# Patient Record
Sex: Male | Born: 1980 | Race: Black or African American | Hispanic: No | Marital: Single | Smoking: Never smoker
Health system: Southern US, Community
[De-identification: ages and names within clinical notes are randomized; demographics above are authoritative.]

## PROBLEM LIST (undated history)

## (undated) DIAGNOSIS — F840 Autistic disorder: Secondary | ICD-10-CM

## (undated) DIAGNOSIS — R569 Unspecified convulsions: Secondary | ICD-10-CM

## (undated) HISTORY — DX: Autistic disorder: F84.0

---

## 2005-10-23 ENCOUNTER — Other Ambulatory Visit: Payer: Self-pay

## 2005-10-23 ENCOUNTER — Emergency Department: Payer: Self-pay | Admitting: Emergency Medicine

## 2006-03-27 ENCOUNTER — Ambulatory Visit: Payer: Self-pay | Admitting: Internal Medicine

## 2006-04-09 ENCOUNTER — Ambulatory Visit: Payer: Self-pay | Admitting: Internal Medicine

## 2006-04-11 ENCOUNTER — Emergency Department: Payer: Self-pay | Admitting: Emergency Medicine

## 2006-05-09 ENCOUNTER — Ambulatory Visit: Payer: Self-pay | Admitting: Internal Medicine

## 2006-06-09 ENCOUNTER — Ambulatory Visit: Payer: Self-pay | Admitting: Internal Medicine

## 2006-07-16 ENCOUNTER — Ambulatory Visit: Payer: Self-pay | Admitting: Gastroenterology

## 2006-07-20 ENCOUNTER — Emergency Department: Payer: Self-pay | Admitting: Emergency Medicine

## 2006-08-21 ENCOUNTER — Ambulatory Visit: Payer: Self-pay | Admitting: Internal Medicine

## 2006-09-09 ENCOUNTER — Ambulatory Visit: Payer: Self-pay | Admitting: Internal Medicine

## 2006-10-09 ENCOUNTER — Ambulatory Visit: Payer: Self-pay | Admitting: Internal Medicine

## 2006-10-28 ENCOUNTER — Ambulatory Visit: Payer: Self-pay | Admitting: Internal Medicine

## 2006-11-09 ENCOUNTER — Ambulatory Visit: Payer: Self-pay | Admitting: Internal Medicine

## 2006-11-26 ENCOUNTER — Ambulatory Visit: Payer: Self-pay | Admitting: Internal Medicine

## 2006-12-09 ENCOUNTER — Ambulatory Visit: Payer: Self-pay | Admitting: Internal Medicine

## 2007-02-09 ENCOUNTER — Ambulatory Visit: Payer: Self-pay | Admitting: Internal Medicine

## 2007-03-09 ENCOUNTER — Ambulatory Visit: Payer: Self-pay | Admitting: Internal Medicine

## 2007-03-11 ENCOUNTER — Ambulatory Visit: Payer: Self-pay | Admitting: Internal Medicine

## 2007-04-09 ENCOUNTER — Ambulatory Visit: Payer: Self-pay | Admitting: Internal Medicine

## 2007-09-06 ENCOUNTER — Emergency Department: Payer: Self-pay | Admitting: Emergency Medicine

## 2007-09-09 ENCOUNTER — Ambulatory Visit: Payer: Self-pay | Admitting: Internal Medicine

## 2007-10-04 ENCOUNTER — Emergency Department: Payer: Self-pay | Admitting: Emergency Medicine

## 2007-10-06 ENCOUNTER — Ambulatory Visit: Payer: Self-pay | Admitting: Neurology

## 2008-05-14 IMAGING — CT CT CHEST-ABD-PELV W/ CM
1 series · 16 of 33 positions shown, 20 images · non-contrast
Comparison: none

REASON FOR EXAM: weight loss    neutropenia
COMMENTS:

[Series 2: soft tissue · axial · 0.69mm/px · z∈[+68,+658]mm · 16 of 133 slices shown, 20 images]
[im 10/133  mediastinal]
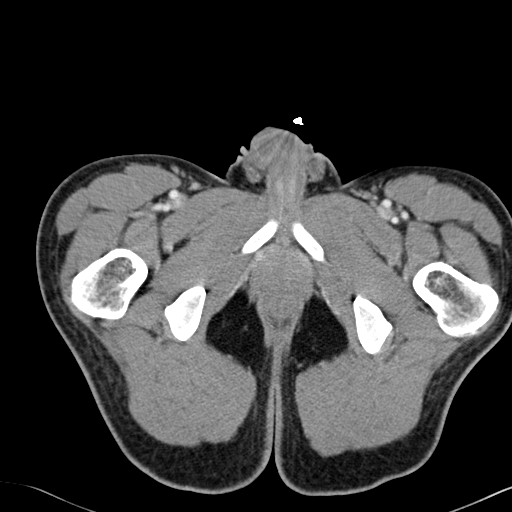
[im 10/133  bone]
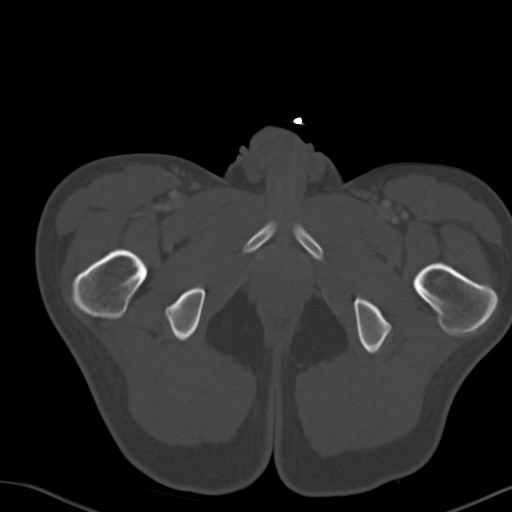
[im 20/133  mediastinal]
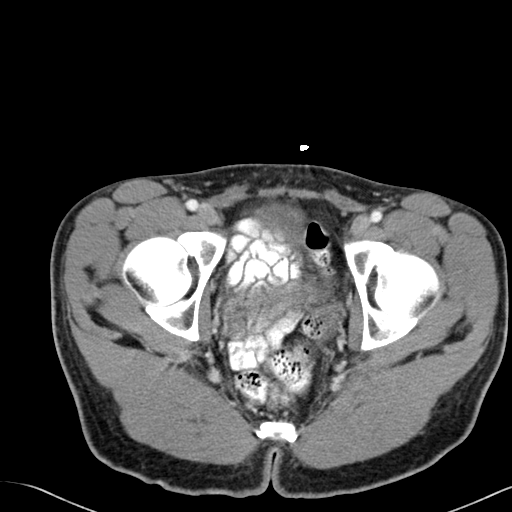
[im 27/133  mediastinal]
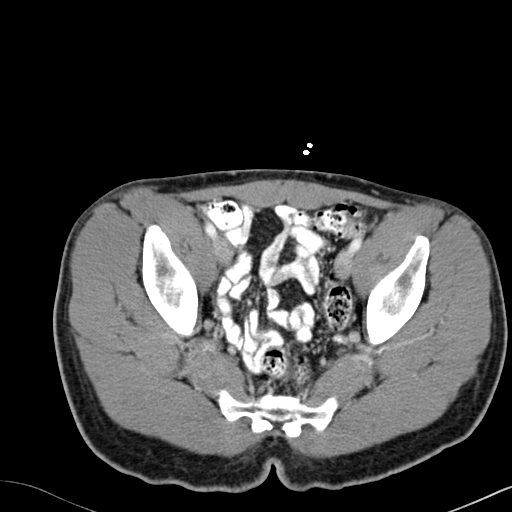
[im 35/133  mediastinal]
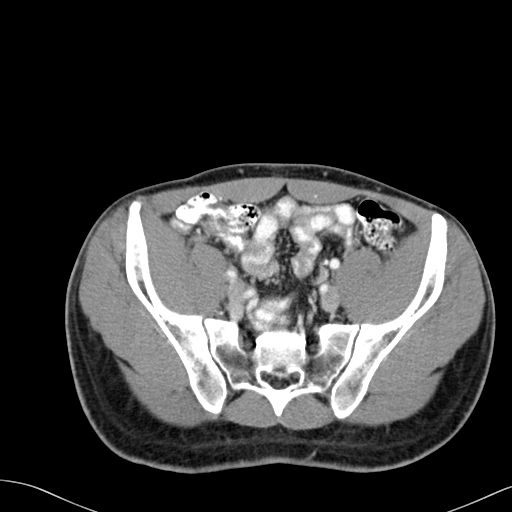
[im 45/133  mediastinal]
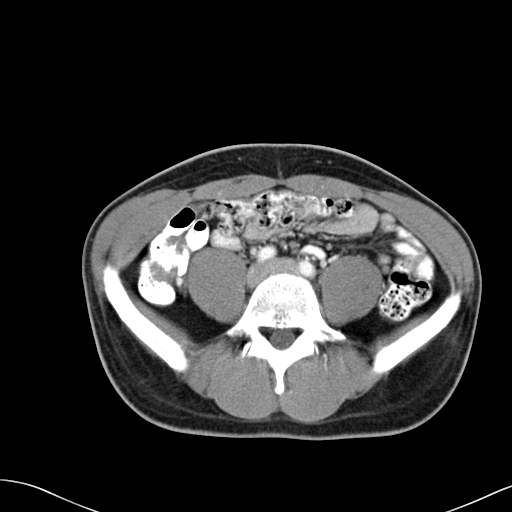
[im 54/133  mediastinal]
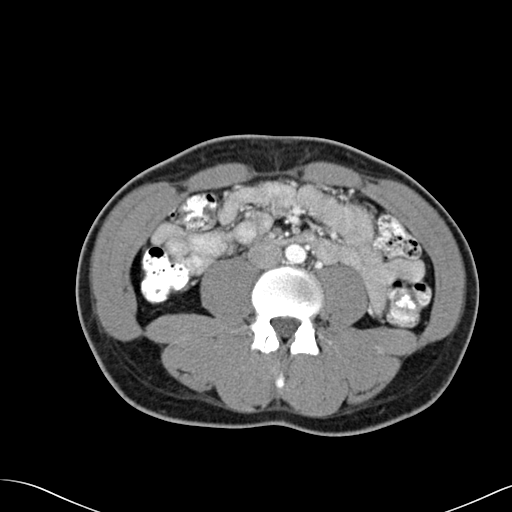
[im 64/133  mediastinal]
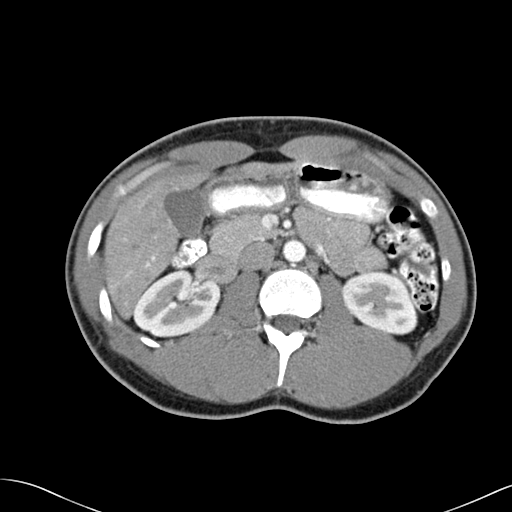
[im 69/133  mediastinal]
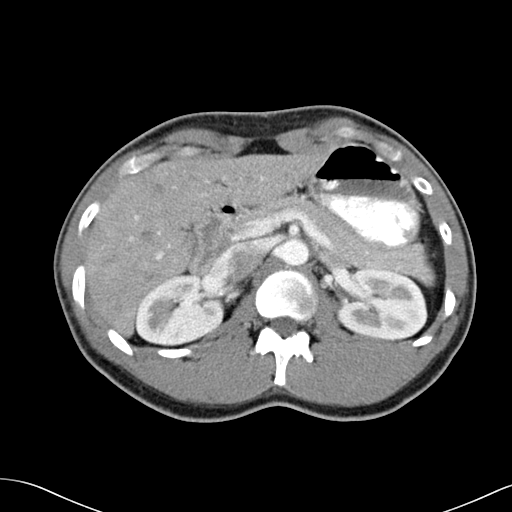
[im 79/133  mediastinal]
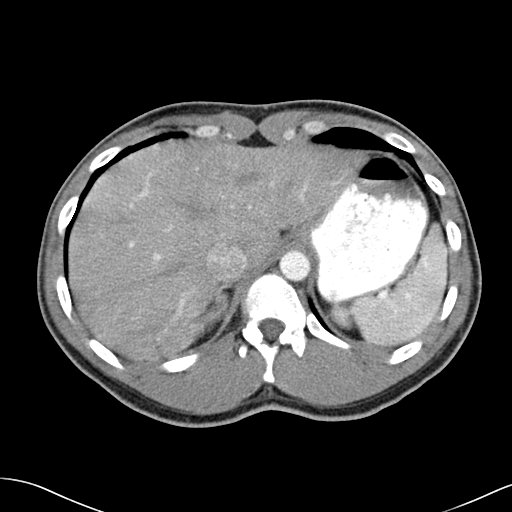
[im 79/133  bone]
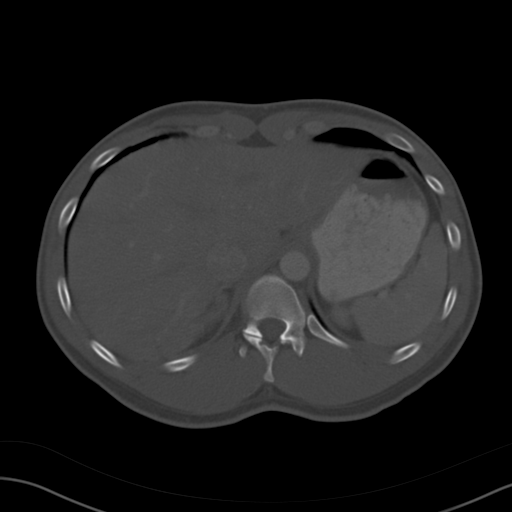
[im 89/133  mediastinal]
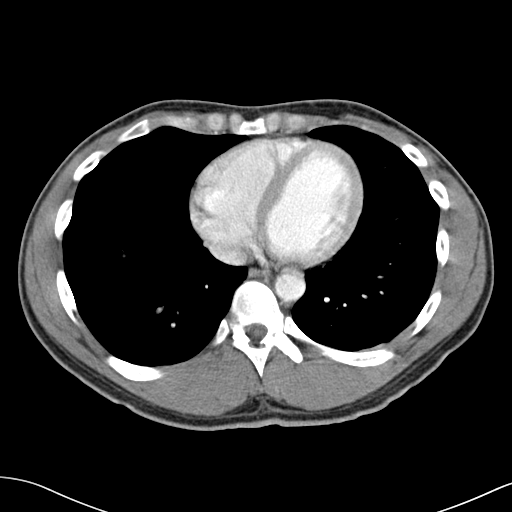
[im 98/133  mediastinal]
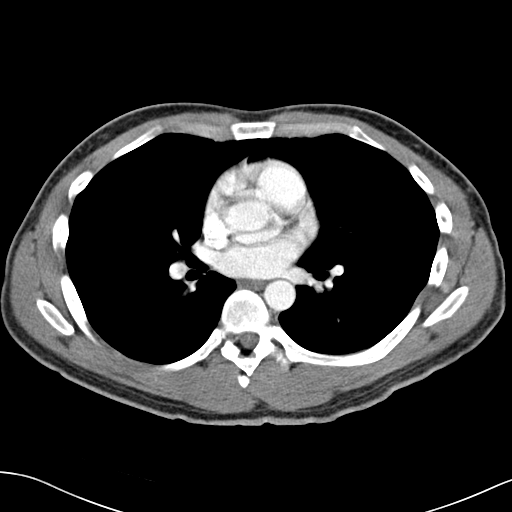
[im 106/133  mediastinal]
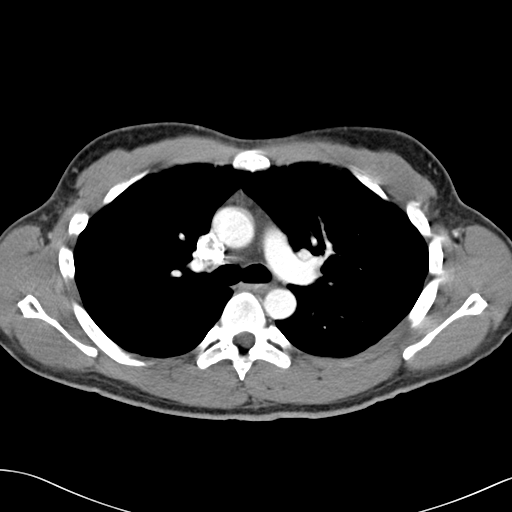
[im 113/133  mediastinal]
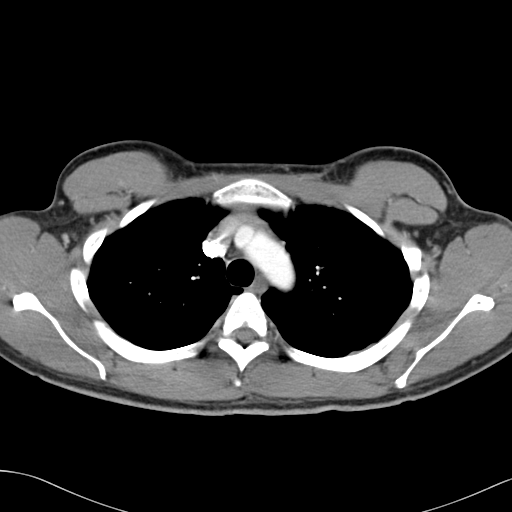
[im 113/133  lung]
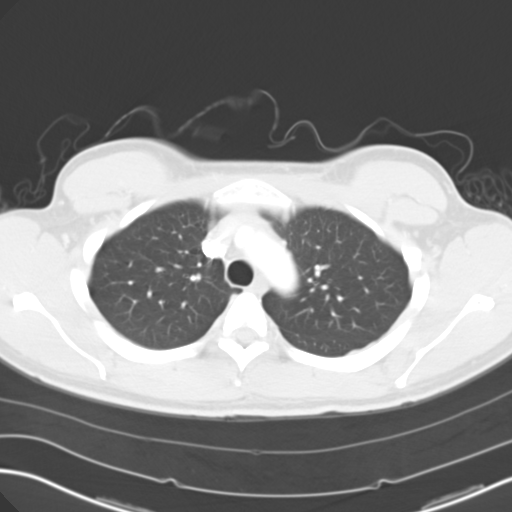
[im 118/133  lung]
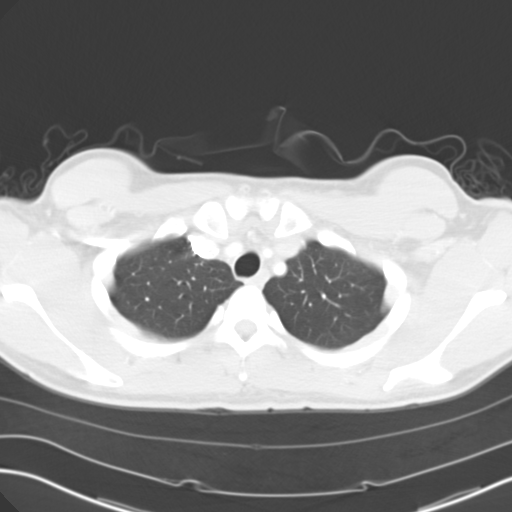
[im 123/133  mediastinal]
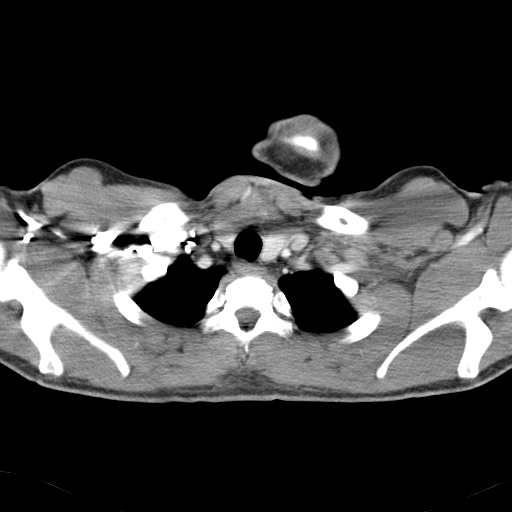
[im 123/133  lung]
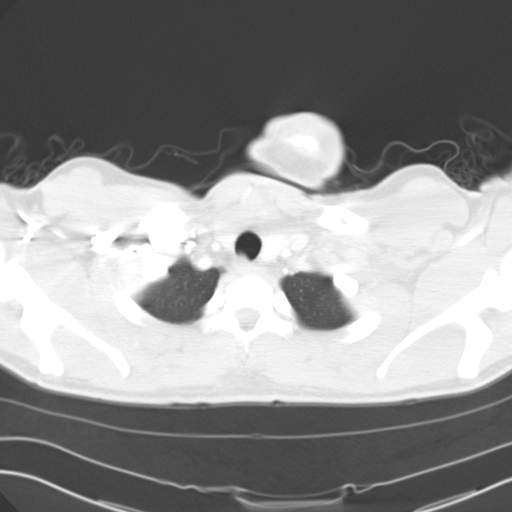
[im 128/133  lung]
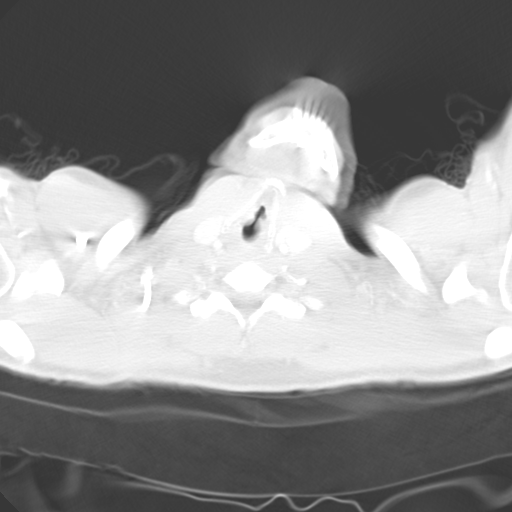

[16 of 33 positions shown; findings below may reference images not displayed]

PROCEDURE:     CT  - CT CHEST ABDOMEN AND PELVIS W  - April 10, 2006 [DATE]

RESULT:     IV and oral contrast-enhanced CT scan of the chest, abdomen and
pelvis is performed. The lungs are clear of nodules. There is no mediastinal
or hilar mass. There is no axillary adenopathy. There is no pleural or
pericardial effusion. The abdominal viscera appear to be normal. There is no
pancreatic mass. No radiopaque gallstones are evident. There is no
retroperitoneal or pelvic adenopathy evident.
IMPRESSION: 1.     No focal pulmonary or mediastinal mass or adenopathy.
2.     No evidence of bowel obstruction or bowel wall thickening. No acute
inflammatory process. No adenopathy or mass is present.

## 2008-09-04 ENCOUNTER — Emergency Department: Payer: Self-pay | Admitting: Emergency Medicine

## 2008-10-23 ENCOUNTER — Emergency Department: Payer: Self-pay | Admitting: Emergency Medicine

## 2009-04-29 ENCOUNTER — Emergency Department: Payer: Self-pay | Admitting: Emergency Medicine

## 2010-10-09 IMAGING — CR DG SHOULDER 3+V*R*
1 series · 3 of 3 positions shown · non-contrast
Comparison: none

REASON FOR EXAM: increased pain and ? deformity
COMMENTS:   LMP: (Male)

[Series 1: view not recorded · 0.17mm/px · 3 of 3 slices shown]
[im 1/3]
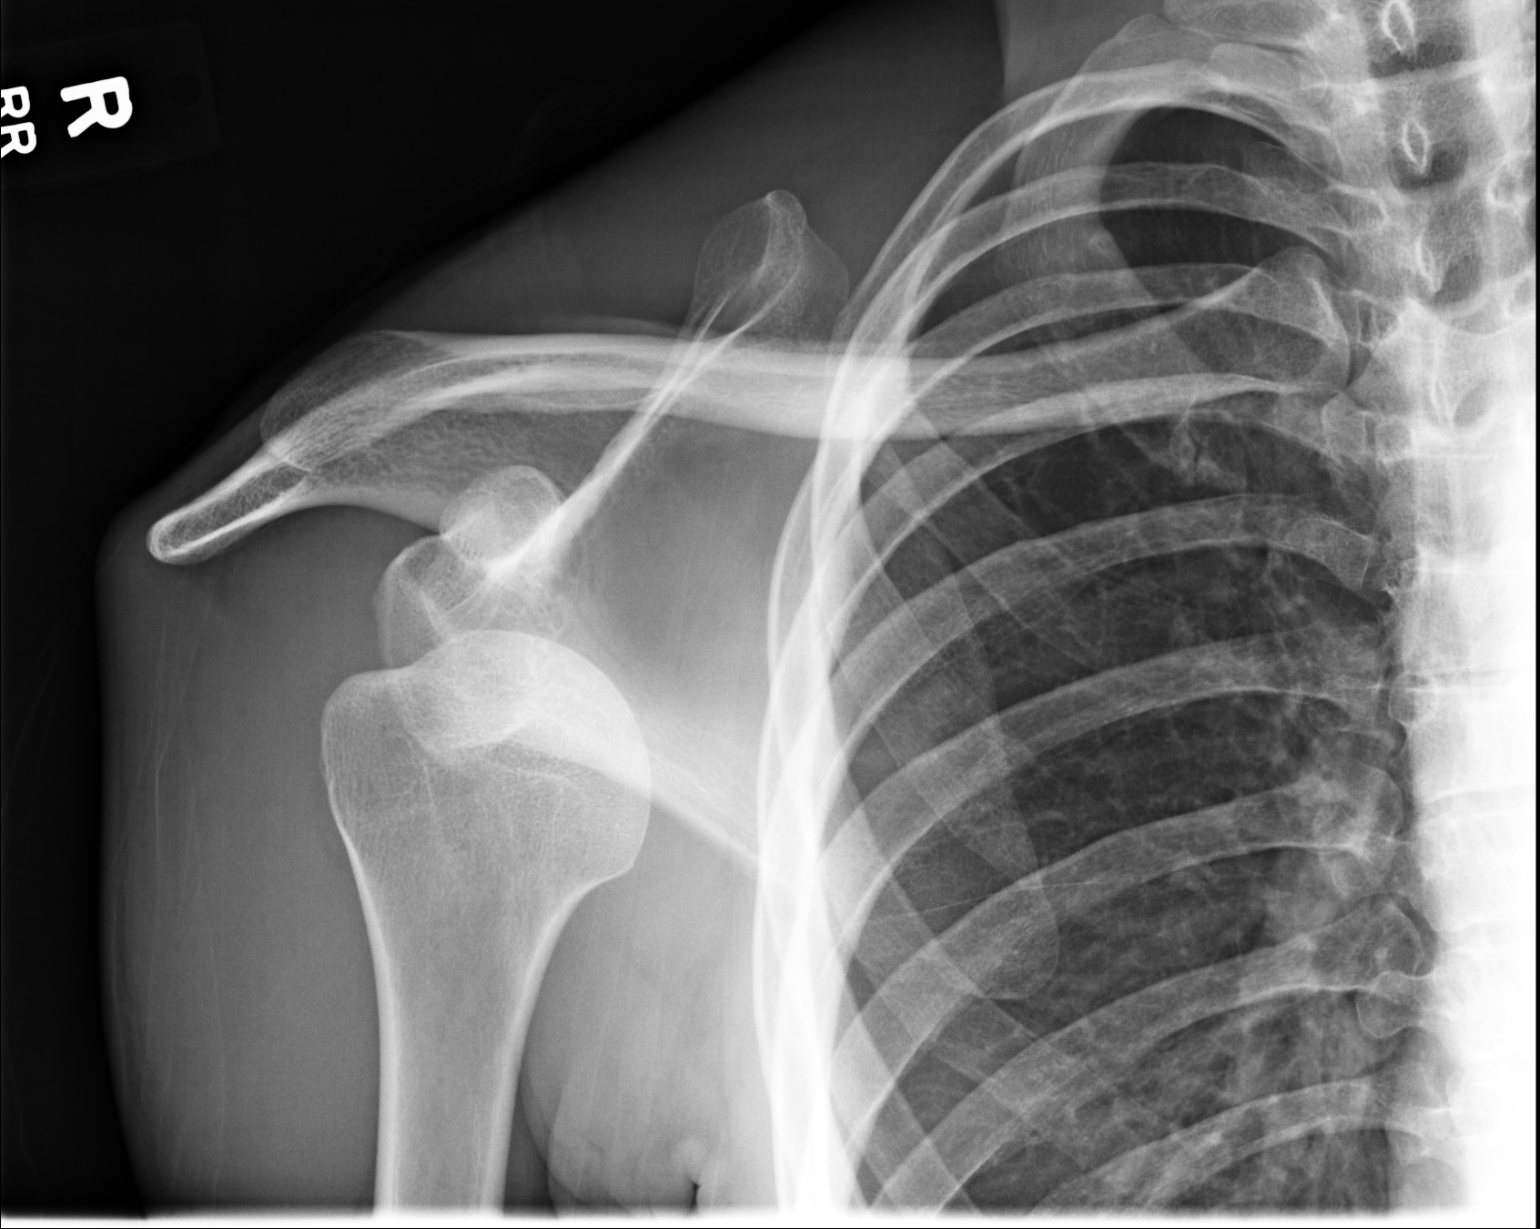
[im 2/3]
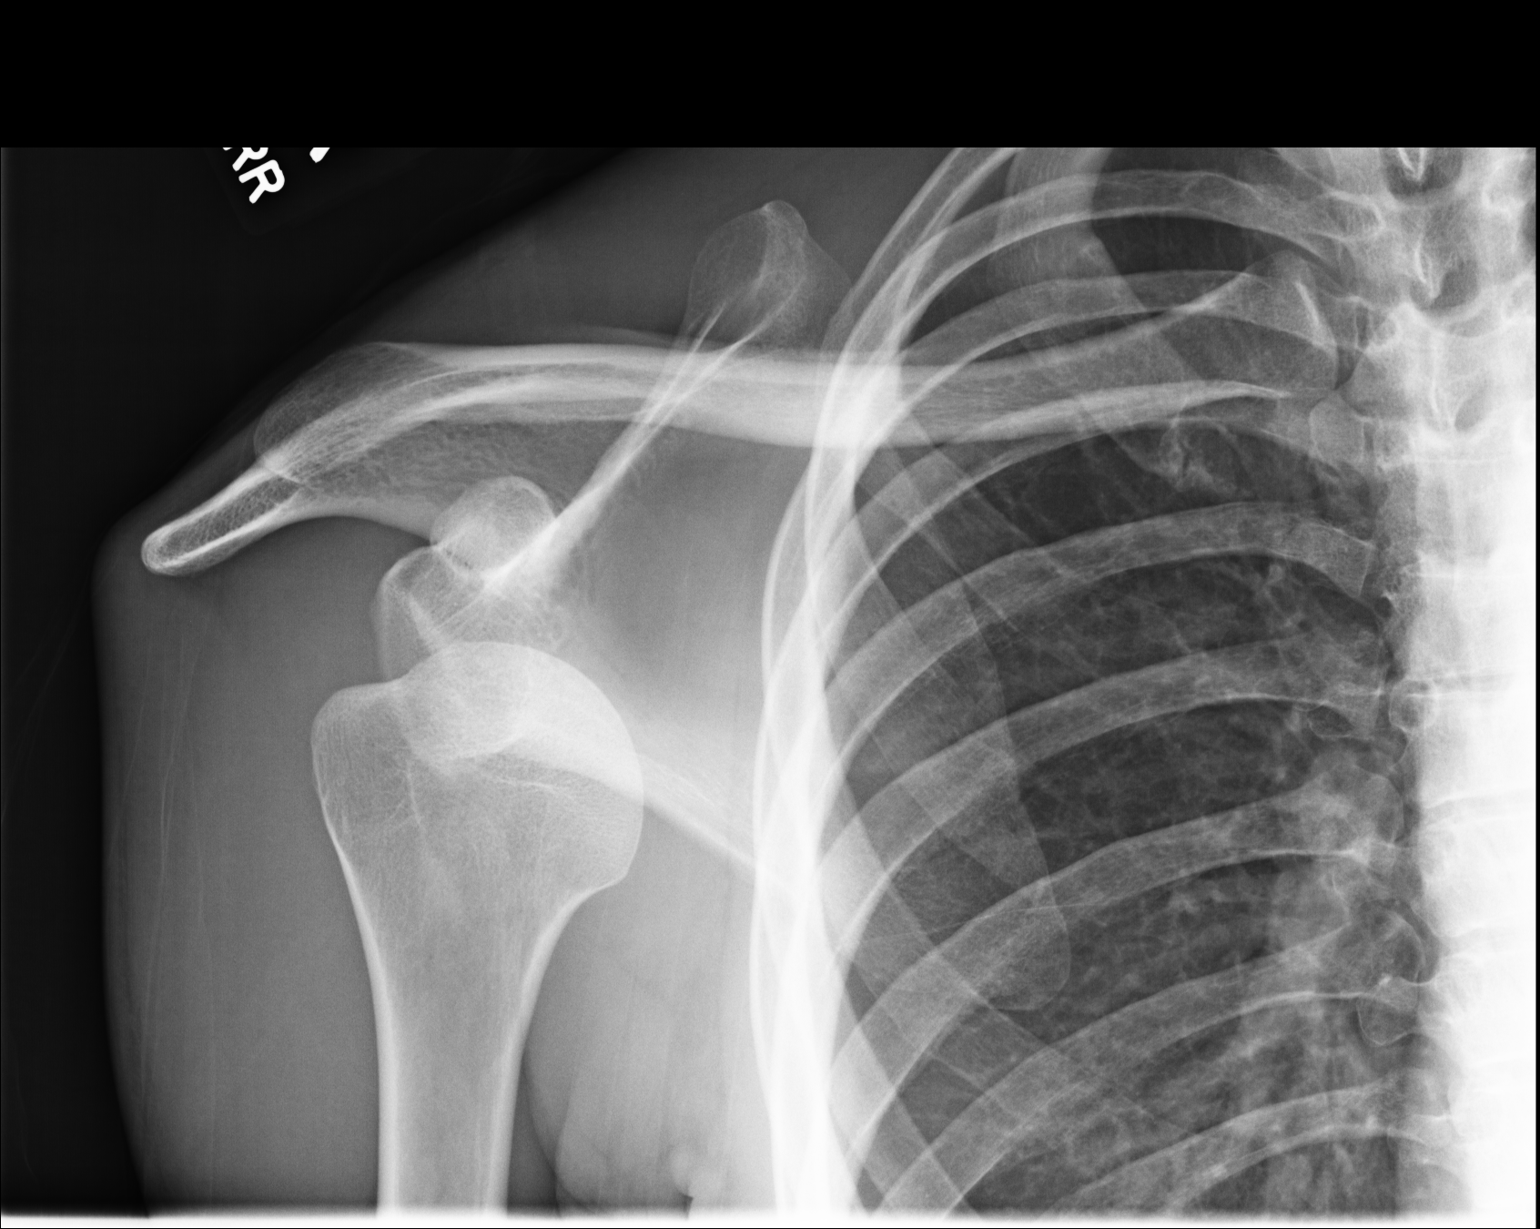
[im 3/3]
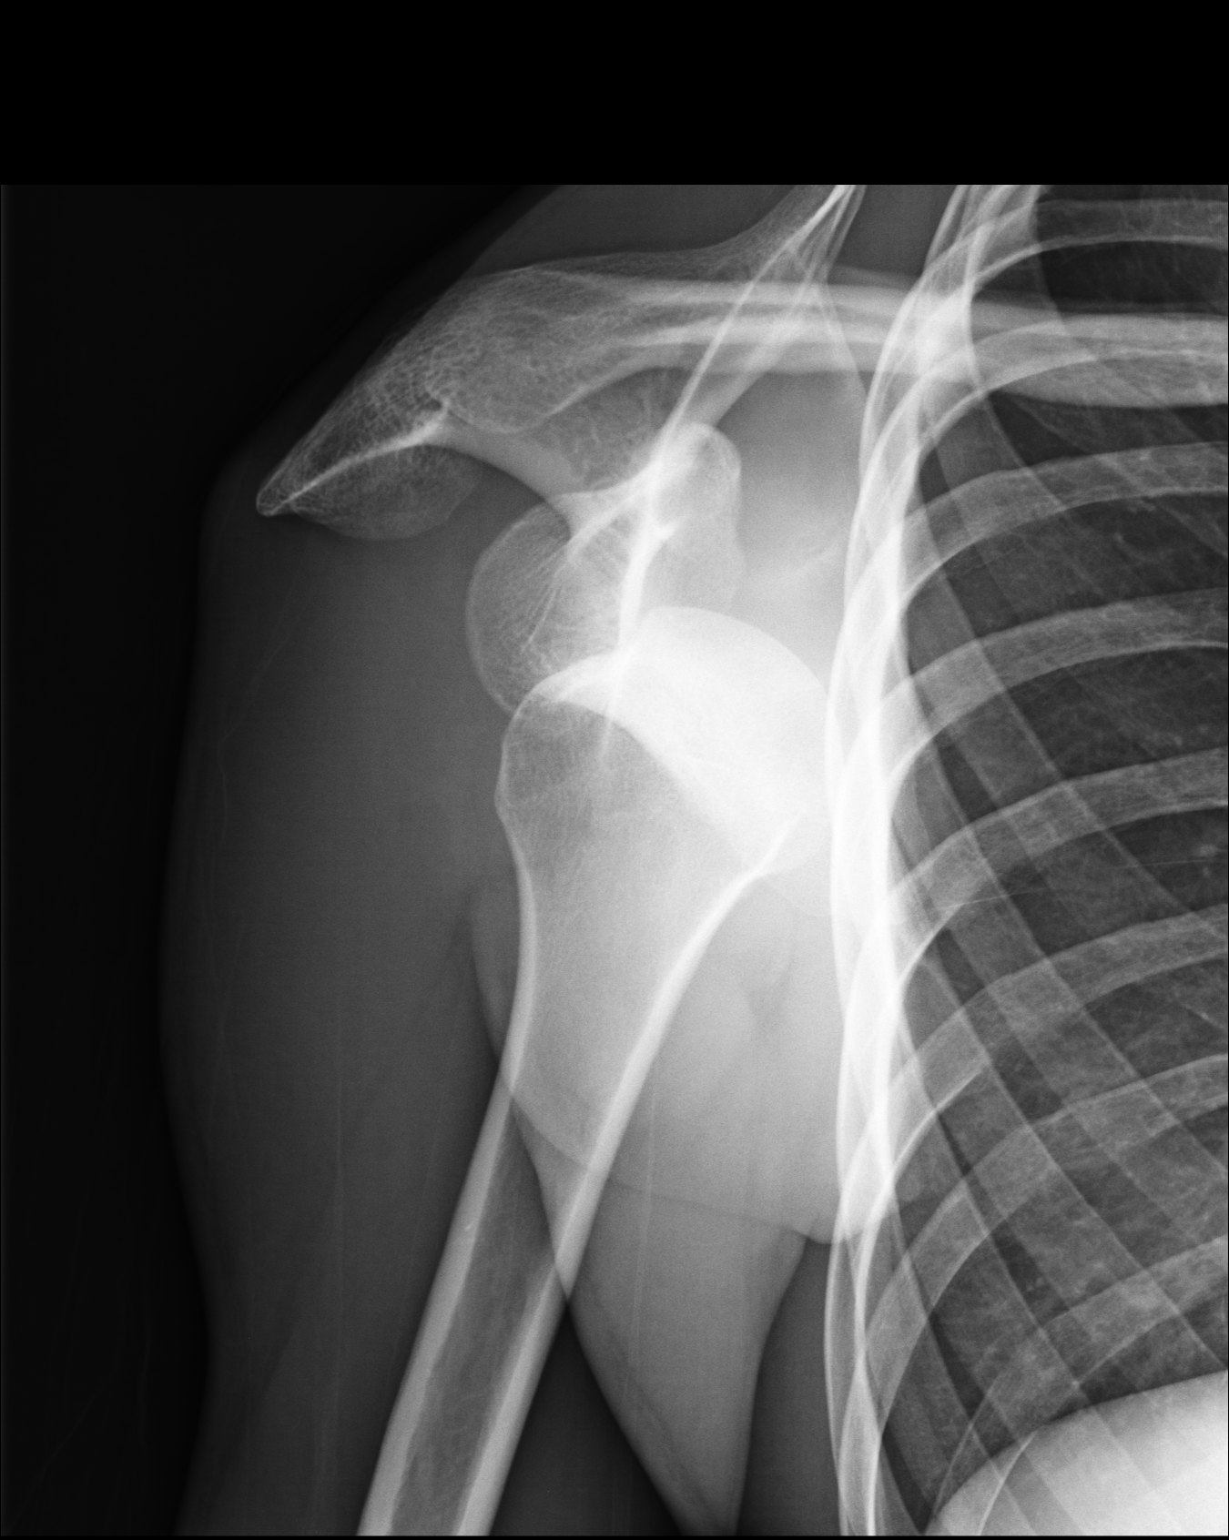

[3 of 3 positions shown; findings below may reference images not displayed]

PROCEDURE:     DXR - DXR SHOULDER RIGHT COMPLETE  - September 04, 2008  [DATE]

RESULT:      Comparison to prior study dated 10/04/07.

There has been dislocation of the humeral head. The humeral head projects in
an inferior position. There does not appear to be evidence of fracture. The
right lung apex is unremarkable.
IMPRESSION: Right shoulder dislocation.

## 2012-01-07 ENCOUNTER — Emergency Department: Payer: Self-pay | Admitting: Emergency Medicine

## 2012-01-07 LAB — CBC
HCT: 44.5 % (ref 40.0–52.0)
HGB: 14.6 g/dL (ref 13.0–18.0)
RBC: 5.35 10*6/uL (ref 4.40–5.90)
RDW: 14.5 % (ref 11.5–14.5)
WBC: 6.1 10*3/uL (ref 3.8–10.6)

## 2012-01-07 LAB — COMPREHENSIVE METABOLIC PANEL
Albumin: 4 g/dL (ref 3.4–5.0)
Anion Gap: 6 — ABNORMAL LOW (ref 7–16)
Bilirubin,Total: 0.1 mg/dL — ABNORMAL LOW (ref 0.2–1.0)
Calcium, Total: 8.8 mg/dL (ref 8.5–10.1)
Chloride: 105 mmol/L (ref 98–107)
Co2: 28 mmol/L (ref 21–32)
Creatinine: 0.82 mg/dL (ref 0.60–1.30)
EGFR (African American): >60
EGFR (Non-African Amer.): >60
Glucose: 102 mg/dL — ABNORMAL HIGH (ref 65–99)
Osmolality: 277 (ref 275–301)
SGOT(AST): 37 U/L (ref 15–37)
Sodium: 139 mmol/L (ref 136–145)

## 2012-03-04 ENCOUNTER — Emergency Department: Payer: Self-pay | Admitting: Emergency Medicine

## 2014-02-10 IMAGING — CR DG SHOULDER 3+V*R*
1 series · 4 of 4 positions shown · non-contrast
Comparison: none

REASON FOR EXAM: seizurej - possible shoulder dislocation
COMMENTS:

PROCEDURE:     DXR - DXR SHOULDER RIGHT COMPLETE  - January 07, 2012  [DATE]
RESULT:     Subcoracoid anterior right shoulder dislocation is present. No
evidence of fracture.

[Series 1: w shoulder external right · 0.14mm/px · 4 of 4 slices shown]
[im 1/4]
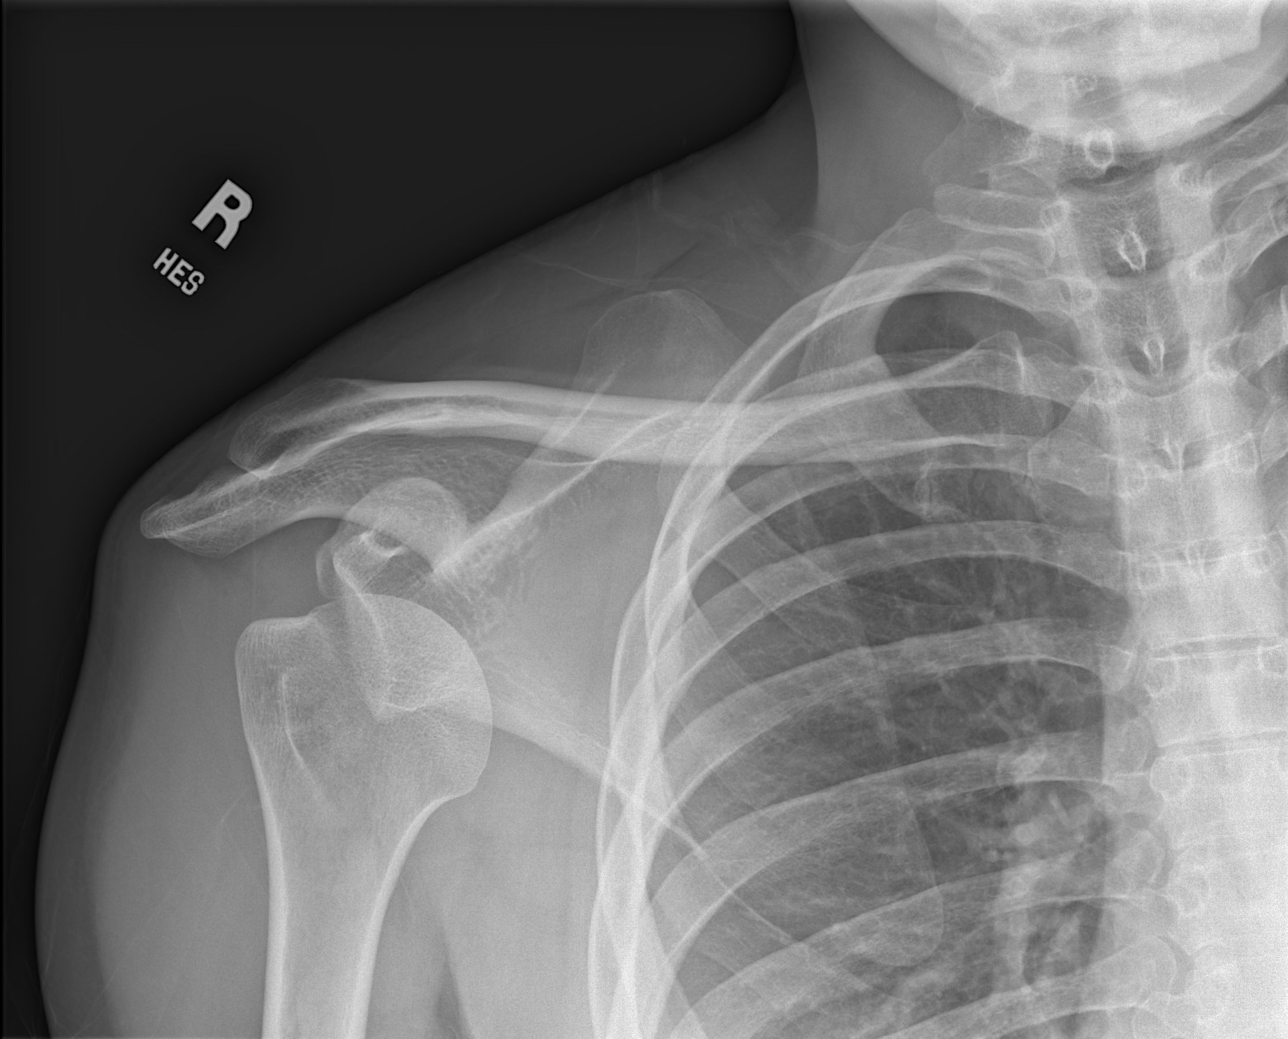
[im 2/4]
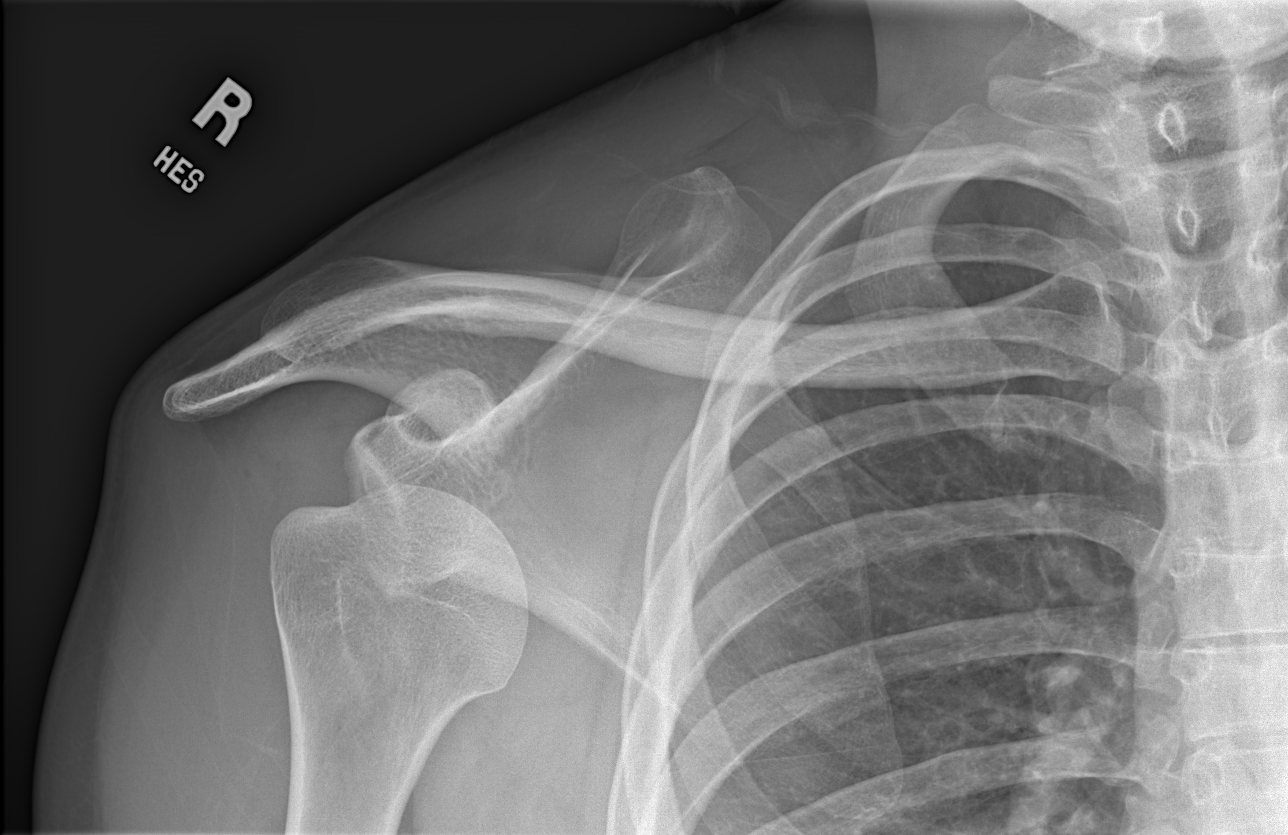
[im 3/4]
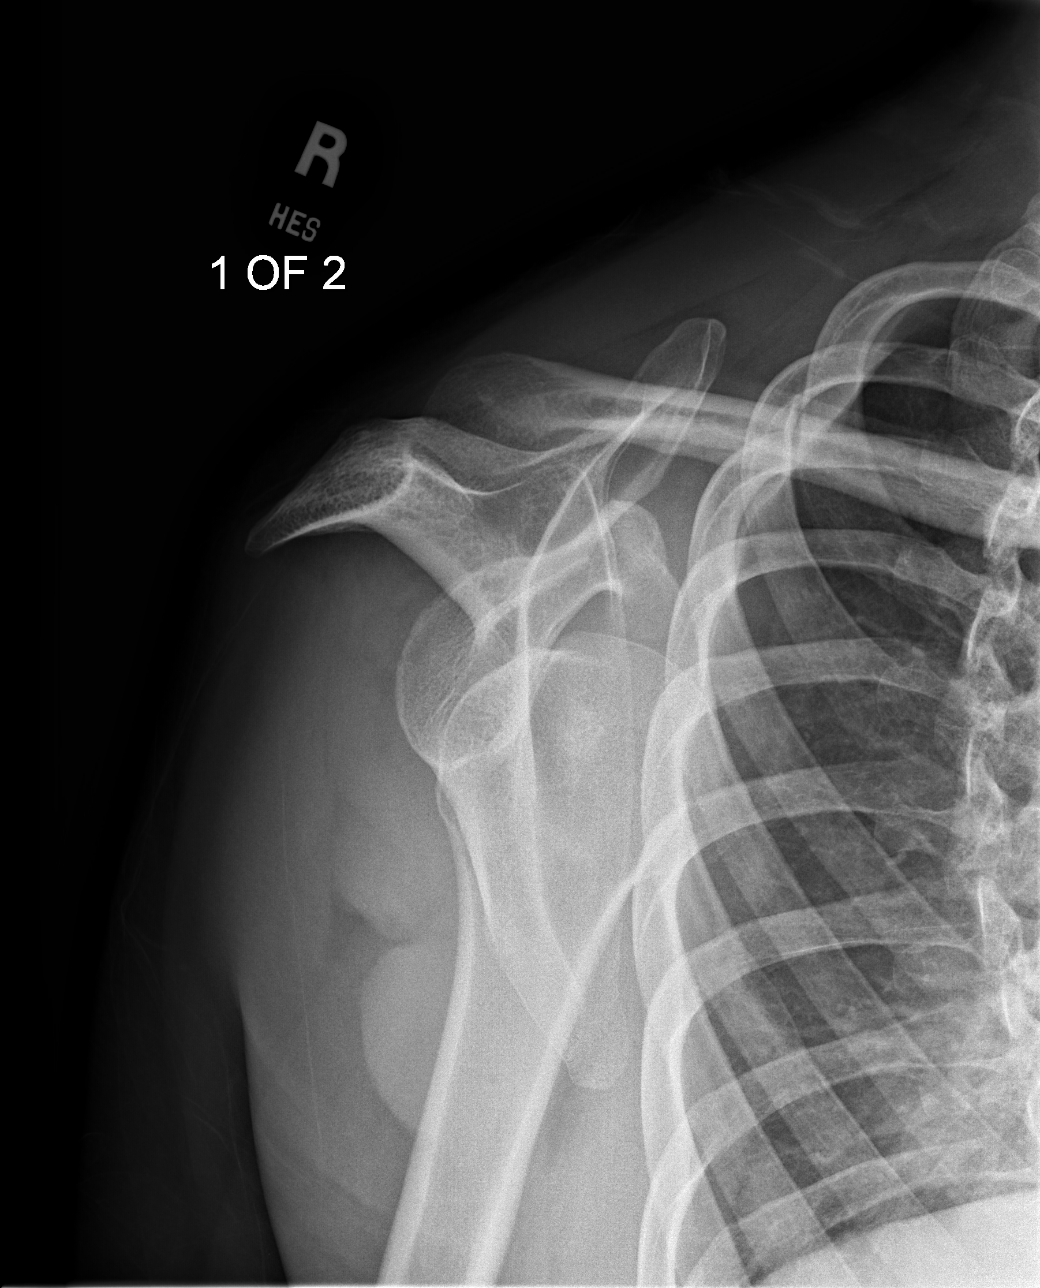
[im 4/4]
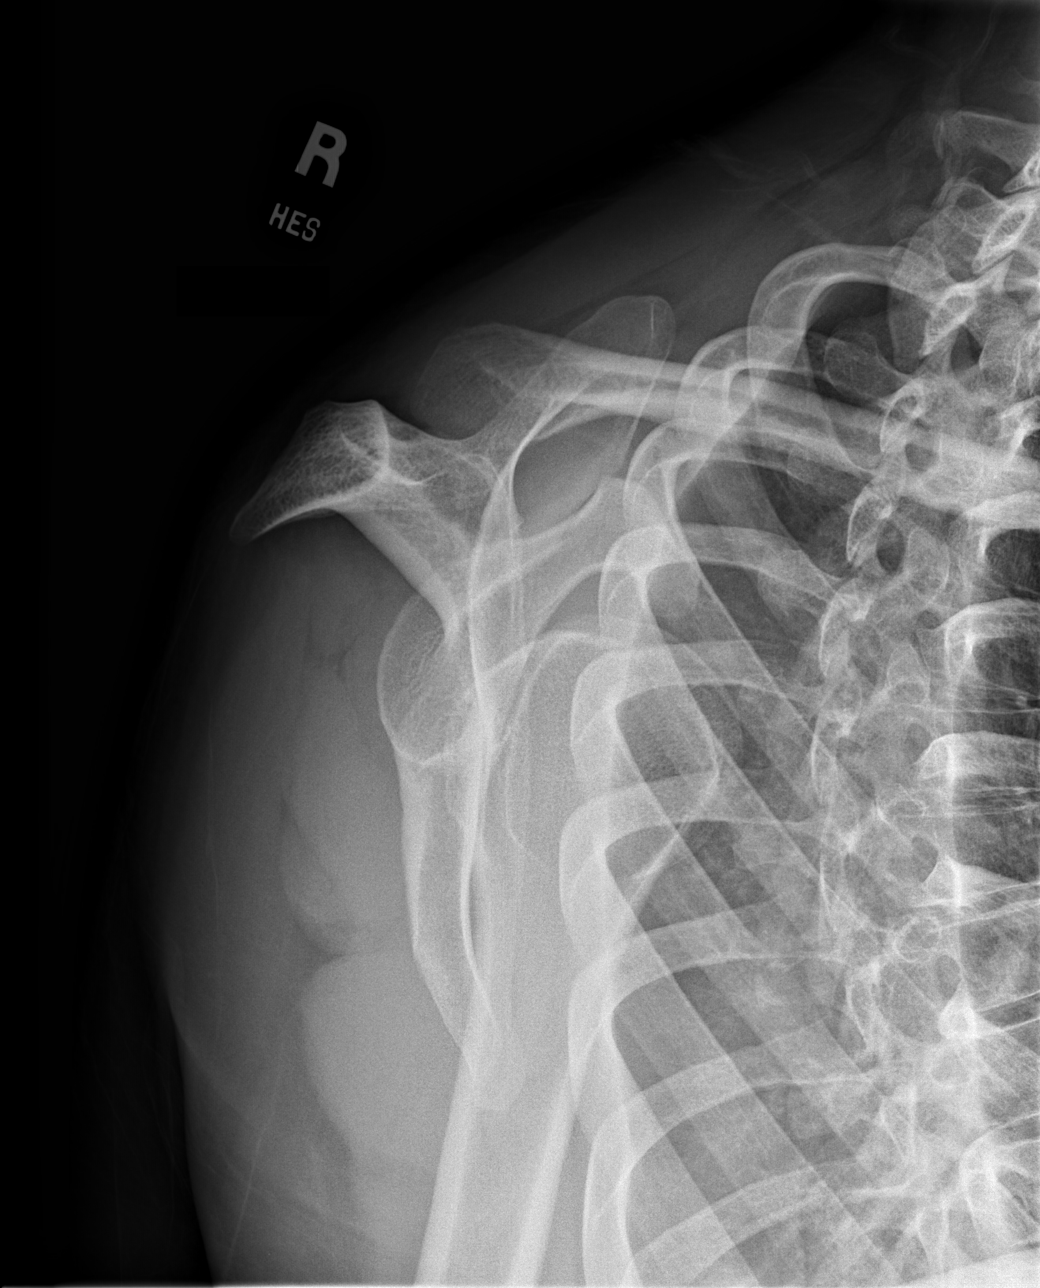

[4 of 4 positions shown; findings below may reference images not displayed]

IMPRESSION: Subcoracoid right shoulder dislocation.

## 2014-11-15 ENCOUNTER — Emergency Department
Admission: EM | Admit: 2014-11-15 | Discharge: 2014-11-15 | Disposition: A | Discharge status: Home / Self Care | Payer: Medicare Other | Attending: Student | Admitting: Student

## 2014-11-15 ENCOUNTER — Encounter: Payer: Self-pay | Admitting: Medical Oncology

## 2014-11-15 DIAGNOSIS — Z79899 Other long term (current) drug therapy: Secondary | ICD-10-CM | POA: Diagnosis not present

## 2014-11-15 DIAGNOSIS — R569 Unspecified convulsions: Secondary | ICD-10-CM

## 2014-11-15 DIAGNOSIS — G40909 Epilepsy, unspecified, not intractable, without status epilepticus: Secondary | ICD-10-CM | POA: Insufficient documentation

## 2014-11-15 HISTORY — DX: Unspecified convulsions: R56.9

## 2014-11-15 LAB — BASIC METABOLIC PANEL
ANION GAP: 13 (ref 5–15)
BUN: 10 mg/dL (ref 6–20)
CHLORIDE: 105 mmol/L (ref 101–111)
CO2: 21 mmol/L — AB (ref 22–32)
Calcium: 8.9 mg/dL (ref 8.9–10.3)
Creatinine, Ser: 1.32 mg/dL — ABNORMAL HIGH (ref 0.61–1.24)
GFR calc Af Amer: >60 mL/min (ref >60)
GFR calc non Af Amer: >60 mL/min (ref >60)
GLUCOSE: 104 mg/dL — AB (ref 65–99)
POTASSIUM: 3.5 mmol/L (ref 3.5–5.1)
Sodium: 139 mmol/L (ref 135–145)

## 2014-11-15 LAB — CBC
HEMATOCRIT: 43.1 % (ref 40.0–52.0)
HEMOGLOBIN: 14.2 g/dL (ref 13.0–18.0)
MCH: 27 pg (ref 26.0–34.0)
MCHC: 32.9 g/dL (ref 32.0–36.0)
MCV: 82.3 fL (ref 80.0–100.0)
Platelets: 309 10*3/uL (ref 150–440)
RBC: 5.24 MIL/uL (ref 4.40–5.90)
RDW: 13.9 % (ref 11.5–14.5)
WBC: 5 10*3/uL (ref 3.8–10.6)

## 2014-11-15 MED ORDER — SODIUM CHLORIDE 0.9 % IV BOLUS (SEPSIS)
500.0000 mL | Freq: Once | INTRAVENOUS | Status: AC
  Administered 2014-11-15: 500 mL via INTRAVENOUS

## 2014-11-15 MED ORDER — LEVETIRACETAM 500 MG PO TABS
1000.0000 mg | ORAL_TABLET | Freq: Once | ORAL | Status: AC
  Administered 2014-11-15: 1000 mg via ORAL
  Filled 2014-11-15: qty 2

## 2014-11-15 MED ORDER — ACETAMINOPHEN 500 MG PO TABS
1000.0000 mg | ORAL_TABLET | Freq: Once | ORAL | Status: AC
  Administered 2014-11-15: 1000 mg via ORAL
  Filled 2014-11-15: qty 2

## 2014-11-15 MED ORDER — LEVETIRACETAM 1000 MG PO TABS: 1000.0000 mg | ORAL_TABLET | Freq: Two times a day (BID) | ORAL | Status: DC

## 2014-11-15 NOTE — ED Notes (Signed)
Pt from bank where he was with his mother when he had a seizure in the parking lot. Pt has hx of seizure and has been out of his meds for a couple of days. Pt is MR and mother is care taker- mother is poor historian also.

## 2014-11-15 NOTE — ED Provider Notes (Signed)
[facility] Emergency Department Provider Note  ____________________________________________  Time seen: Approximately 12:05 PM  I have reviewed the triage vital signs and the nursing notes.   HISTORY  Chief Complaint Seizures  Caveat-history of present illness and review of systems Limited secondary to the patient's mental retardation as well as postictal state. Information obtained from his mother/caregiver at bedside.  HPI Miguel Hale is a 34 y.o. male history of mental retardation as well as seizures on Keppra 1000 mg by mouth twice a day who presents for evaluation of witnessed generalized tonic-clonic seizure which occurred suddenly just prior to arrival, now resolved. Mother reports that he has generally been in good health however just prior to arrival he had a seizure in the parking lot the bank. Mother reports that he told her today that he has been out of his Keppra for a few days. Otherwise he has had no recent illness include no cough, sneezing, congestion, vomiting, diarrhea, fevers or chills. No chest pain or difficulty breathing. No modifying factors. He has not had a seizure in almost a year.   Past Medical History  Diagnosis Date  . Seizures (HCC)     There are no active problems to display for this patient.   No past surgical history on file.  Current Outpatient Rx  Name  Route  Sig  Dispense  Refill  . levETIRAcetam (KEPPRA) 1000 MG tablet   Oral   Take 1 tablet by mouth 2 (two) times daily.      12   . sertraline (ZOLOFT) 50 MG tablet   Oral   Take 1 tablet by mouth daily.      4     Allergies Review of patient's allergies indicates no known allergies.  No family history on file.  Social History Social History  Substance Use Topics  . Smoking status: Not on file  . Smokeless tobacco: Not on file  . Alcohol Use: Not on file    Review of Systems Constitutional: No fever/chills Eyes: No visual changes. ENT: No sore  throat. Cardiovascular: Denies chest pain. Respiratory: Denies shortness of breath. Gastrointestinal: No abdominal pain.  No nausea, no vomiting.  No diarrhea.  No constipation. Genitourinary: Negative for dysuria. Musculoskeletal: Negative for back pain. Skin: Negative for rash. Neurological: Negative for headaches, focal weakness or numbness.   Caveat-history of present illness and review of systems Limited secondary to the patient's mental retardation as well as postictal state. Information obtained from his mother/caregiver at bedside. ____________________________________________   PHYSICAL EXAM:  VITAL SIGNS: ED Triage Vitals  Enc Vitals Group     BP 11/15/14 1155 130/83 mmHg     Pulse Rate 11/15/14 1155 83     Resp 11/15/14 1155 20     Temp 11/15/14 1155 97.8 F (36.6 C)     Temp Source 11/15/14 1155 Oral     SpO2 11/15/14 1155 96 %     Weight 11/15/14 1155 236 lb (107.049 kg)     Height 11/15/14 1155 6' (1.829 m)     Head Cir --      Peak Flow --      Pain Score --      Pain Loc --      Pain Edu? --      Excl. in GC? --     Constitutional: Alert and oriented to self-only, not to year or place. Appears confused and answers "yeah" to most questions. in no acute distress. Eyes: Conjunctivae are normal. PERRL. EOMI.  Head: Atraumatic. Nose: No congestion/rhinnorhea. Mouth/Throat: Mucous membranes are moist.  Oropharynx non-erythematous. Neck: No stridor. No cervical spine tenderness to palpation. Cardiovascular: Normal rate, regular rhythm. Grossly normal heart sounds.  Good peripheral circulation. Respiratory: Normal respiratory effort.  No retractions. Lungs CTAB. Gastrointestinal: Soft and nontender. No distention. No CVA tenderness. Genitourinary: deferred Musculoskeletal: No lower extremity tenderness nor edema.  No joint effusions. Neurologic:  Normal speech and language. No gross focal neurologic deficits are appreciated. 5/5 strength in bilateral upper and  lower extremities, sensation intact to light touch throughout Skin:  Skin is warm, dry and intact. No rash noted. Psychiatric: Mood and affect are normal. Speech and behavior are normal.  ____________________________________________   LABS (all labs ordered are listed, but only abnormal results are displayed)  Labs Reviewed  BASIC METABOLIC PANEL - Abnormal; Notable for the following:    CO2 21 (*)    Glucose, Bld 104 (*)    Creatinine, Ser 1.32 (*)    All other components within normal limits  CBC  CBG MONITORING, ED   ____________________________________________  EKG  ED ECG REPORT I, Gayla Doss, the attending physician, personally viewed and interpreted this ECG.   Date: 11/15/2014  EKG Time: 11:57  Rate: 85  Rhythm: normal sinus rhythm  Axis: normal  Intervals:none  ST&T Change: J-point elevation in anterior leads, likely benign early repolarization.  ____________________________________________  RADIOLOGY  none ____________________________________________   PROCEDURES  Procedure(s) performed: None  Critical Care performed: No  ____________________________________________   INITIAL IMPRESSION / ASSESSMENT AND PLAN / ED COURSE  Pertinent labs & imaging results that were available during my care of the patient were reviewed by me and considered in my medical decision making (see chart for details).  [name] is a 34 y.o. male history of mental retardation as well as seizures on Keppra 1000 mg by mouth twice a day who presents for evaluation of witnessed generalized tonic-clonic seizure which occurred suddenly just prior to arrival. On exam, he is in no acute distress, he does appear confused and I suspect this is secondary to postictal state. He does have an intact neurological exam and his examination is atraumatic/benign. When I asked if he had any pain, he did respond "yeah" but was unable to tell me where his pain was. Will give Tylenol. Suspect  that his seizure today is secondary to being out of his Keppra so will give Keppra here in the emergency department and observe. Plan for basic labs, frequent reassessments.  ----------------------------------------- 3:13 PM on 11/15/2014 -----------------------------------------  She is awake and alert. He still is not oriented to place or to year however his mother at bedside reports that this is his baseline mental status. Still with intact neurological exam. Vital signs stable. Labs reviewed and are notable for mild creatinine elevation at 1.32, IV fluids were given. Normal CBC. He was given his home dose of Keppra. He was observed for 3 hours without recurrence of seizure activity. We discussed return precautions, need for compliance with Keppra, need for close neurology follow-up in the patient and his mother at bedside are comfortable with the discharge plan.Will refill keppra. ____________________________________________   FINAL CLINICAL IMPRESSION(S) / ED DIAGNOSES  Final diagnoses:  Seizure (HCC)      Gayla Doss, MD 11/15/14 1514

## 2020-10-12 ENCOUNTER — Ambulatory Visit: Payer: Self-pay | Admitting: Urology

## 2020-10-18 ENCOUNTER — Other Ambulatory Visit: Payer: Self-pay

## 2020-10-18 ENCOUNTER — Encounter: Payer: Self-pay | Admitting: Urology

## 2020-10-18 ENCOUNTER — Telehealth: Payer: Self-pay

## 2020-10-18 ENCOUNTER — Ambulatory Visit (INDEPENDENT_AMBULATORY_CARE_PROVIDER_SITE_OTHER): Payer: Medicare Other | Admitting: Urology

## 2020-10-18 VITALS — BP 149/98 | HR 73 | Ht 72.0 in | Wt 220.0 lb

## 2020-10-18 DIAGNOSIS — R3129 Other microscopic hematuria: Secondary | ICD-10-CM | POA: Diagnosis not present

## 2020-10-18 DIAGNOSIS — N182 Chronic kidney disease, stage 2 (mild): Secondary | ICD-10-CM

## 2020-10-18 LAB — MICROSCOPIC EXAMINATION
Bacteria, UA: NONE SEEN
Epithelial Cells (non renal): NONE SEEN /hpf (ref 0–10)

## 2020-10-18 LAB — URINALYSIS, COMPLETE
Bilirubin, UA: NEGATIVE
Glucose, UA: NEGATIVE
Ketones, UA: NEGATIVE
Leukocytes,UA: NEGATIVE
Nitrite, UA: NEGATIVE
Specific Gravity, UA: 1.02 (ref 1.005–1.030)
Urobilinogen, Ur: 0.2 mg/dL (ref 0.2–1.0)
pH, UA: 7 (ref 5.0–7.5)

## 2020-10-18 MED ORDER — DIAZEPAM 10 MG PO TABS: 10.0000 mg | ORAL_TABLET | Freq: Once | ORAL | 0 refills | Status: AC

## 2020-10-18 NOTE — Patient Instructions (Signed)

## 2020-10-18 NOTE — Progress Notes (Signed)
10/18/2020 9:57 AM   Miguel Hale March 03, 1980 [id number]  Referring provider: Jerl Mina, MD [address] [place],  [place] [address]  Chief Complaint  Patient presents with   Hematuria    HPI: 40 year old male referred for further evaluation of microscopic hematuria.  He has a personal history of epilepsy and is accompanied today by an aide.  Miguel Hale is a ward of [facility].  We were able to contact his caseworker as well as her manager who is that the official healthcare proxy for consent for treatment today as well as consent for cystoscopy.  He underwent routine urinalysis as part of his annual health screening on 09/20/2020.  This showed 10-50 red blood cells per high-powered field and otherwise unremarkable urine.  This is also present on the urinalysis on 09/01/2020 in the same quantity.  Today, he is a limited historian.  He does deny blood in his urine or any urinary issues.  No history of kidney stones.  Is unclear how accurate his history is.  He does have CKD, baseline creatinine approximately 1.6.   PMH: Past Medical History:  Diagnosis Date   Autism    Seizures (HCC)     Surgical History: History reviewed. No pertinent surgical history.  Home Medications:  Allergies as of 10/18/2020   No Known Allergies      Medication List        Accurate as of October 18, 2020  9:57 AM. If you have any questions, ask your nurse or doctor.          STOP taking these medications    levETIRAcetam 1000 MG tablet Commonly known as: Keppra Stopped by: Vanna Scotland, MD   sertraline 50 MG tablet Commonly known as: ZOLOFT Stopped by: Vanna Scotland, MD       TAKE these medications    albuterol 108 (90 Base) MCG/ACT inhaler Commonly known as: VENTOLIN HFA Inhale into the lungs.   amLODipine 10 MG tablet Commonly known as: NORVASC Take 10 mg by mouth daily.   fluticasone-salmeterol 100-50 MCG/ACT Aepb Commonly known as:  ADVAIR Inhale into the lungs.   hydrochlorothiazide 25 MG tablet Commonly known as: HYDRODIURIL Take 25 mg by mouth daily.   Oxcarbazepine 300 MG tablet Commonly known as: TRILEPTAL Take 300 mg by mouth 2 (two) times daily.   potassium chloride 10 MEQ tablet Commonly known as: KLOR-CON Take 10 mEq by mouth 2 (two) times daily.        Allergies: No Known Allergies  Family History: Family History  Adopted: Yes  Family history unknown: Yes    Social History:  has no history on file for tobacco use, alcohol use, and drug use.   Physical Exam: BP (!) 149/98   Pulse 73   Ht 6' (1.829 m)   Wt 220 lb (99.8 kg)   BMI 29.84 kg/m   Constitutional:  Alert and oriented, No acute distress.  Extremely pleasant, smiling. HEENT: [place] AT, moist mucus membranes.  Trachea midline, no masses. Cardiovascular: No clubbing, cyanosis, or edema. Respiratory: Normal respiratory effort, no increased work of breathing. Skin: No rashes, bruises or suspicious lesions. Neurologic: Grossly intact, no focal deficits, moving all 4 extremities. Psychiatric: Normal mood and affect.  Laboratory Data: Care everywhere reviewed  Urinalysis Urinalysis today with persistent microscopic blood, 11-30 red blood cells otherwise UA unremarkable  Assessment & Plan:    1. Microscopic hematuria Patient is somewhat limited historian but does have fairly significant degree of microscopic hematuria with  no recent evaluation  At this point, I recommend a modified hematuria evaluation given his relatively young age and absence of other known risk factors.  We will plan for renal ultrasound and cystoscopy.  His care to cover today does think that he will be able to tolerate a cystoscopy.  He is able to tolerate blood draws and other procedural intervention.  She does think that premedication with Valium may be helpful.  We will send this to his new pharmacy.  We did obtain consent for his upcoming cystoscopy via  fax to his healthcare proxy who is aware and agrees. - Urinalysis, Complete - Ultrasound renal complete; Future  2. Stage 2 chronic kidney disease Will evaluate for any structural abnormality with the above    Vanna Scotland, MD  [facility] [address] [place], [place] [address] [phone]  I spent 45 total minutes on the day of the encounter including pre-visit review of the medical record, face-to-face time with the patient, and post visit ordering of labs/imaging/tests.

## 2020-10-18 NOTE — Telephone Encounter (Addendum)
Left message for case worker Theodis Shove at [phone]  Left message for [employer] [phone].    We need consent to treat. And information on pt health care proxy etc.    I spoke with Theodis Shove. As per Shanda Bumps, the director of [place] department of social services consents and makes patients medical and financial decisions.   Director is [employer].   Shanda Bumps would like [place] to fax consent form for Cystoscopy ahead of time so director Candace can sign consent. Fax #  is [phone]   I spoke personally to Anselmo Pickler and received consent for patients office visit today.   Theodis Shove will fax over letter stating pt is under the care of [facility] department of social services.

## 2020-11-01 ENCOUNTER — Ambulatory Visit
Admission: RE | Admit: 2020-11-01 | Discharge: 2020-11-01 | Disposition: A | Discharge status: Home / Self Care | Payer: Medicare Other | Source: Ambulatory Visit | Attending: Urology | Admitting: Urology

## 2020-11-01 ENCOUNTER — Other Ambulatory Visit: Payer: Self-pay

## 2020-11-01 DIAGNOSIS — R3129 Other microscopic hematuria: Secondary | ICD-10-CM | POA: Insufficient documentation

## 2020-11-14 NOTE — Progress Notes (Signed)
   11/15/2020  CC:  Chief Complaint  Patient presents with   Cysto     HPI: Miguel Hale is a 40 y.o. male with a personal history of microscopic hematuria, stage II CKD, and epilepsy, who presents today for a cystoscopy   He underwent routine urinalysis as part of his annual health screening on 09/20/2020.  This showed 10-50 red blood cells per high-powered field and otherwise unremarkable urine.  This is also present on the urinalysis on 09/01/2020 in the same quantity.  RUS on 11/01/2020 revealed mildly echogenic kidneys that may represent medical renal disease. No hydronephrosis or shadowing stone.   We did receive written consent today from the patient's legal guardian as well as consent from the patient to proceed today.  NED. A&Ox3.   No respiratory distress   Abd soft, NT, ND Normal phallus with bilateral descended testicles  Cystoscopy Procedure Note  Patient identification was confirmed, informed consent was obtained, and patient was prepped using Betadine solution.  Lidocaine jelly was administered per urethral meatus.     Pre-Procedure: - Inspection reveals a normal caliber ureteral meatus.  Procedure: The flexible cystoscope was introduced without difficulty - No urethral strictures/lesions are present. - Normal prostate  - Normal bladder neck - Bilateral ureteral orifices identified - Bladder mucosa  reveals no ulcers, tumors, or lesions - No bladder stones - No trabeculation  Retroflexion shows unremarkable   Post-Procedure: - Patient tolerated the procedure well  Assessment/ Plan:  Microscopic hematuria  - Renal ultrasound revealed mildly echogenic kidneys that may represent medical renal disease. No hydronephrosis or shadowing stone.  - Cystoscopy unremarkable   2. Stage 2 chronic kidney disease - No structural abnormality. As above    [employer] as a scribe for Vanna Scotland, MD.,have documented all relevant documentation on the  behalf of Vanna Scotland, MD,as directed by  Vanna Scotland, MD while in the presence of Vanna Scotland, MD.  I have reviewed the above documentation for accuracy and completeness, and I agree with the above.   Vanna Scotland, MD

## 2020-11-15 ENCOUNTER — Ambulatory Visit (INDEPENDENT_AMBULATORY_CARE_PROVIDER_SITE_OTHER): Payer: Medicare Other | Admitting: Urology

## 2020-11-15 ENCOUNTER — Other Ambulatory Visit: Payer: Self-pay

## 2020-11-15 DIAGNOSIS — R3129 Other microscopic hematuria: Secondary | ICD-10-CM | POA: Diagnosis not present

## 2020-11-15 LAB — URINALYSIS, COMPLETE
Bilirubin, UA: NEGATIVE
Glucose, UA: NEGATIVE
Ketones, UA: NEGATIVE
Leukocytes,UA: NEGATIVE
Nitrite, UA: NEGATIVE
Specific Gravity, UA: 1.015 (ref 1.005–1.030)
Urobilinogen, Ur: 0.2 mg/dL (ref 0.2–1.0)
pH, UA: 7 (ref 5.0–7.5)

## 2020-11-15 LAB — MICROSCOPIC EXAMINATION
Bacteria, UA: NONE SEEN
Epithelial Cells (non renal): NONE SEEN /hpf (ref 0–10)

## 2020-11-16 ENCOUNTER — Other Ambulatory Visit: Payer: Self-pay | Admitting: Internal Medicine

## 2020-11-16 ENCOUNTER — Telehealth: Payer: Self-pay | Admitting: Urology

## 2020-11-16 DIAGNOSIS — N1831 Chronic kidney disease, stage 3a: Secondary | ICD-10-CM

## 2020-11-16 NOTE — Telephone Encounter (Signed)
Tried to call Miguel Hale at [phone] (Child psychotherapist at [employer])  unable to leave message voicemail full.

## 2020-11-16 NOTE — Telephone Encounter (Signed)
Received faxed labs from primary care on 11/07/2020 indicating elevated creatinine to 1.76.  In the setting of negative hematuria evaluation and ultrasound with medical renal disease, I think that he needs to be seen and evaluated by a nephrologist.  I placed a referral today.  Please let his healthcare proxy know that we are recommending this.  Vanna Scotland, MD

## 2021-03-28 ENCOUNTER — Other Ambulatory Visit: Payer: Self-pay | Admitting: Nephrology

## 2021-03-28 DIAGNOSIS — R809 Proteinuria, unspecified: Secondary | ICD-10-CM

## 2021-04-03 ENCOUNTER — Other Ambulatory Visit: Payer: Self-pay | Admitting: Radiology

## 2021-04-03 NOTE — Progress Notes (Signed)
Patient with history of Autism, ward of state, guardian  as in chart, called with PA/Shannon present with procedure fully explained with verbal/phone consent obtained as well as consent per Legal guardian obtained in Dr Garnett Farm office previously for Renal biopsy. Due to patients medical history, general anesthesia will be with patient for procedure as well. It was instructed as well at this time to have patient be NPO after MN prior to day of procedure, and any meds with sip of water, someone will also need to accompany patient for procedure. Stated understanding. ?

## 2021-04-04 ENCOUNTER — Ambulatory Visit: Admission: RE | Admit: 2021-04-04 | Payer: Medicare Other | Source: Ambulatory Visit | Admitting: Radiology

## 2021-04-04 ENCOUNTER — Ambulatory Visit
Admission: RE | Admit: 2021-04-04 | Discharge: 2021-04-04 | Disposition: A | Discharge status: Home / Self Care | Payer: Medicare Other | Source: Ambulatory Visit | Attending: Nephrology | Admitting: Nephrology

## 2021-04-07 NOTE — Progress Notes (Signed)
Patient on schedule for Renal biopsy with anesthesia assistance, called and spoke with care giver/personal Miguel Hale with pre procedure instructions given. Made aware to be NPO after MN prior to procedure as well as driver/caregiver with patient post procedure/recovery/discharge. Stated understanding.  ?

## 2021-04-18 ENCOUNTER — Other Ambulatory Visit: Payer: Self-pay

## 2021-04-18 ENCOUNTER — Emergency Department
Admission: EM | Admit: 2021-04-18 | Discharge: 2021-04-19 | Disposition: A | Discharge status: Home / Self Care | Payer: Medicare Other | Attending: Emergency Medicine | Admitting: Emergency Medicine

## 2021-04-18 ENCOUNTER — Encounter: Payer: Self-pay | Admitting: Emergency Medicine

## 2021-04-18 DIAGNOSIS — R4182 Altered mental status, unspecified: Secondary | ICD-10-CM | POA: Insufficient documentation

## 2021-04-18 DIAGNOSIS — R569 Unspecified convulsions: Secondary | ICD-10-CM

## 2021-04-18 DIAGNOSIS — Y900 Blood alcohol level of less than 20 mg/100 ml: Secondary | ICD-10-CM | POA: Diagnosis not present

## 2021-04-18 DIAGNOSIS — F840 Autistic disorder: Secondary | ICD-10-CM | POA: Insufficient documentation

## 2021-04-18 LAB — BASIC METABOLIC PANEL
Anion gap: 17 — ABNORMAL HIGH (ref 5–15)
BUN: 14 mg/dL (ref 6–20)
CO2: 18 mmol/L — ABNORMAL LOW (ref 22–32)
Calcium: 8.5 mg/dL — ABNORMAL LOW (ref 8.9–10.3)
Chloride: 99 mmol/L (ref 98–111)
Creatinine, Ser: 1.58 mg/dL — ABNORMAL HIGH (ref 0.61–1.24)
GFR, Estimated: 56 mL/min — ABNORMAL LOW (ref >60)
Glucose, Bld: 92 mg/dL (ref 70–99)
Potassium: 4.3 mmol/L (ref 3.5–5.1)
Sodium: 134 mmol/L — ABNORMAL LOW (ref 135–145)

## 2021-04-18 LAB — URINE DRUG SCREEN, QUALITATIVE (ARMC ONLY)
Amphetamines, Ur Screen: NOT DETECTED
Barbiturates, Ur Screen: NOT DETECTED
Benzodiazepine, Ur Scrn: NOT DETECTED
Cannabinoid 50 Ng, Ur ~~LOC~~: NOT DETECTED
Cocaine Metabolite,Ur ~~LOC~~: NOT DETECTED
MDMA (Ecstasy)Ur Screen: NOT DETECTED
Methadone Scn, Ur: NOT DETECTED
Opiate, Ur Screen: NOT DETECTED
Phencyclidine (PCP) Ur S: NOT DETECTED
Tricyclic, Ur Screen: NOT DETECTED

## 2021-04-18 LAB — CBC WITH DIFFERENTIAL/PLATELET
Abs Immature Granulocytes: 0.01 10*3/uL (ref 0.00–0.07)
Basophils Absolute: 0 10*3/uL (ref 0.0–0.1)
Basophils Relative: 1 %
Eosinophils Absolute: 0.1 10*3/uL (ref 0.0–0.5)
Eosinophils Relative: 3 %
HCT: 39.2 % (ref 39.0–52.0)
Hemoglobin: 12.3 g/dL — ABNORMAL LOW (ref 13.0–17.0)
Immature Granulocytes: 0 %
Lymphocytes Relative: 47 %
Lymphs Abs: 2.5 10*3/uL (ref 0.7–4.0)
MCH: 26.2 pg (ref 26.0–34.0)
MCHC: 31.4 g/dL (ref 30.0–36.0)
MCV: 83.4 fL (ref 80.0–100.0)
Monocytes Absolute: 0.4 10*3/uL (ref 0.1–1.0)
Monocytes Relative: 8 %
Neutro Abs: 2.1 10*3/uL (ref 1.7–7.7)
Neutrophils Relative %: 41 %
Platelets: 334 10*3/uL (ref 150–400)
RBC: 4.7 MIL/uL (ref 4.22–5.81)
RDW: 14.2 % (ref 11.5–15.5)
WBC: 5.1 10*3/uL (ref 4.0–10.5)
nRBC: 0 % (ref 0.0–0.2)

## 2021-04-18 LAB — ETHANOL: Alcohol, Ethyl (B): <10 mg/dL (ref <10)

## 2021-04-18 MED ORDER — SODIUM CHLORIDE 0.9 % IV BOLUS
1000.0000 mL | Freq: Once | INTRAVENOUS | Status: AC
  Administered 2021-04-18: 1000 mL via INTRAVENOUS

## 2021-04-18 NOTE — ED Triage Notes (Signed)
Patient to ED via [facility] from [facility] for an unwitnessed seizure. Patient has history of same along with autism. Patient was initially unresponsive with staff at [facility] but now back at baseline per EMS.  ?

## 2021-04-18 NOTE — ED Notes (Signed)
Called EMS for transport back to [facility] Miguel Hale) [place], [place] ?

## 2021-04-18 NOTE — Discharge Instructions (Signed)
Please seek medical attention for any high fevers, chest pain, shortness of breath, change in behavior, persistent vomiting, bloody stool or any other new or concerning symptoms.  

## 2021-04-18 NOTE — ED Provider Notes (Signed)
? ?[facility] ?Provider Note ? ? ? Event Date/Time  ? First MD Initiated Contact with Patient 04/18/21 1654   ?  (approximate) ? ? ?History  ? ?Seizures ? ? ?HPI ? ?Linford Quintela is a 41 y.o. male  who, per clinic note dated 09/06/20 has history of epilepsy, autism, who presents to the emergency department for altered mental status.  Apparently staff at living facility found the patient altered.  They thought the patient had a seizure although no seizure activity was witnessed.  By the time EMS arrived he was more alert and EMS states that at the time of transport he was back to his baseline.  The patient himself cannot give any significant history.  He does complain of a headache.  ? ? ?Physical Exam  ? ?Triage Vital Signs: ?ED Triage Vitals  ?Enc Vitals Group  ?   BP 04/18/21 1634 113/74  ?   Pulse Rate 04/18/21 1634 100  ?   Resp 04/18/21 1634 18  ?   Temp 04/18/21 1634 98.3 ?F (36.8 ?C)  ?   Temp Source 04/18/21 1634 Oral  ?   SpO2 04/18/21 1634 98 %  ?   Weight 04/18/21 1641 188 lb (85.3 kg)  ?   Height 04/18/21 1641 6' (1.829 m)  ?   Head Circumference --   ?   Peak Flow --   ?   Pain Score 04/18/21 1641 0  ? ?Most recent vital signs: ?Vitals:  ? 04/18/21 1634  ?BP: 113/74  ?Pulse: 100  ?Resp: 18  ?Temp: 98.3 ?F (36.8 ?C)  ?SpO2: 98%  ? ? ?General: Awake, alert. ?CV:  Good peripheral perfusion. Regular rate and rhythm. ?Resp:  Normal effort. Lungs clear ?Abd:  No distention. Non tender ?Neuro:  Only answers in one to two word replies. Moving all extremities.  ?ENT:  No laceration or contusion of tongue. ? ? ?ED Results / Procedures / Treatments  ? ?Labs ?(all labs ordered are listed, but only abnormal results are displayed) ?Labs Reviewed  ?CBC WITH DIFFERENTIAL/PLATELET - Abnormal; Notable for the following components:  ?    Result Value  ? Hemoglobin 12.3 (*)   ? All other components within normal limits  ?BASIC METABOLIC PANEL - Abnormal; Notable for the following components:  ? Sodium  134 (*)   ? CO2 18 (*)   ? Creatinine, Ser 1.58 (*)   ? Calcium 8.5 (*)   ? GFR, Estimated 56 (*)   ? Anion gap 17 (*)   ? All other components within normal limits  ?URINE DRUG SCREEN, QUALITATIVE ([facility] ONLY)  ?ETHANOL  ? ? ? ?EKG ? ?IPhineas Semen, attending physician, personally viewed and interpreted this EKG ? ?EKG Time: 1639 ?Rate: 100 ?Rhythm: sinus tachycardia ?Axis: normal ?Intervals: qtc 457 ?QRS: narrow ?ST changes: no st elevation ?Impression: abnormal ekg ? ? ?RADIOLOGY ?None ? ? ?PROCEDURES: ? ?Critical Care performed: No ? ?Procedures ? ? ?MEDICATIONS ORDERED IN ED: ?Medications - No data to display ? ? ?IMPRESSION / MDM / ASSESSMENT AND PLAN / ED COURSE  ?I reviewed the triage vital signs and the nursing notes. ?             ?               ? ?Differential diagnosis includes, but is not limited to, medication noncompliance, breakthrough seizure, drug or alcohol use, electrolyte abnormality. ? ?Patient presents to the emergency department today after apparent seizure.  While  it was not witnessed to did appear postictal to staff and slowly returned to his baseline during EMS transport.  On my exam patient is awake and alert.  No seizure-like activity.  Blood work without any concerning electrolyte abnormalities.  Patient was observed in the emergency department for a number of hours without any further seizure-like episodes or change in mental status.  Given reassuring work-up at this time I do not feel patient necessitates any further inpatient work-up or admission.  We will plan on discharging back to facility. ? ? ?FINAL CLINICAL IMPRESSION(S) / ED DIAGNOSES  ? ?Final diagnoses:  ?Seizure (HCC)  ? ? ? ?Note:  This document was prepared using Dragon voice recognition software and may include unintentional dictation errors. ? ?  ?Phineas Semen, MD ?04/18/21 2322 ? ?

## 2021-04-19 ENCOUNTER — Other Ambulatory Visit: Payer: Self-pay | Admitting: Radiology

## 2021-04-19 NOTE — H&P (Addendum)
? ?Chief Complaint: ?Patient was seen in consultation today for random renal biopsy  ?at the request of [facility] ? ?Referring Physician(s): ?Lateef,Munsoor ? ?Supervising Physician: Mir, Sharen Heck ? ?Patient Status: [place] ? ?History of Present Illness: ?Miguel Hale is a 41 y.o. male w/ PMH of autism, CKD stage 3 and seizures. Pt was referred to IR by Dr. Anthonette Legato for random renal biopsy for proteinuria and microscopic hematuria. Due to pt medical history, procedure will be performed under general anesthesia.  ? ?Past Medical History:  ?Diagnosis Date  ? Autism   ? Seizures ([place])   ? ? ?History reviewed. No pertinent surgical history. ? ?Allergies: ?Patient has no known allergies. ? ?Medications: ?Prior to Admission medications   ?Medication Sig Start Date End Date Taking? Authorizing Provider  ?albuterol (VENTOLIN HFA) 108 (90 Base) MCG/ACT inhaler Inhale into the lungs. 07/21/20   [provider]  ?amLODipine (NORVASC) 10 MG tablet Take 10 mg by mouth daily. 10/10/20   [provider]  ?fluticasone-salmeterol (ADVAIR) 100-50 MCG/ACT AEPB Inhale into the lungs.    [provider]  ?hydrochlorothiazide (HYDRODIURIL) 25 MG tablet Take 25 mg by mouth daily. 10/10/20   [provider]  ?Oxcarbazepine (TRILEPTAL) 300 MG tablet Take 300 mg by mouth 2 (two) times daily. 10/10/20   [provider]  ?potassium chloride (KLOR-CON) 10 MEQ tablet Take 10 mEq by mouth 2 (two) times daily. 09/30/20   [provider]  ?  ? ?Family History  ?Adopted: Yes  ?Family history unknown: Yes  ? ? ?Social History  ? ?Socioeconomic History  ? Marital status: Single  ?  Spouse name: Not on file  ? Number of children: Not on file  ? Years of education: Not on file  ? Highest education level: Not on file  ?Occupational History  ? Not on file  ?Tobacco Use  ? Smoking status: Never  ? Smokeless tobacco: Not on file  ?Substance and Sexual Activity  ? Alcohol use: Not on file  ?  Drug use: Not on file  ? Sexual activity: Not on file  ?Other Topics Concern  ? Not on file  ?Social History Narrative  ? Not on file  ? ?Social Determinants of Health  ? ?Financial Resource Strain: Not on file  ?Food Insecurity: Not on file  ?Transportation Needs: Not on file  ?Physical Activity: Not on file  ?Stress: Not on file  ?Social Connections: Not on file  ? ?Review of Systems: A 12 point ROS discussed and pertinent positives are indicated in the HPI above.  All other systems are negative. ? ?Review of Systems  ?Reason unable to perform ROS: Information provided by caregiver.  ?Constitutional:  Negative for appetite change, chills, fever and unexpected weight change.  ?Respiratory:  Negative for cough and shortness of breath.   ?Cardiovascular:  Negative for chest pain and leg swelling.  ?Gastrointestinal:  Negative for abdominal pain, nausea and vomiting.  ?Skin:  Positive for wound.  ?     Swollen lip from grand mal seizure 3 days ago   ?Neurological:  Positive for seizures.  ? ?Vital Signs: ?BP (!) 143/101   Pulse 75   Temp 99.3 ?F (37.4 ?C) (Oral)   Resp 12   Ht 6' (1.829 m)   Wt 188 lb (85.3 kg)   SpO2 97%   BMI 25.50 kg/m?  ? ?Physical Exam ?Constitutional:   ?   General: He is not in acute distress. ?   Appearance: Normal appearance. He  is not ill-appearing.  ?HENT:  ?   Head: Normocephalic and atraumatic.  ?   Mouth/Throat:  ?   Mouth: Mucous membranes are dry.  ?   Pharynx: Oropharynx is clear.  ?   Comments: Bottom lip swollen from seizure 3 days prior ?Eyes:  ?   Extraocular Movements: Extraocular movements intact.  ?   Pupils: Pupils are equal, round, and reactive to light.  ?Cardiovascular:  ?   Rate and Rhythm: Normal rate and regular rhythm.  ?   Pulses: Normal pulses.  ?   Heart sounds: Normal heart sounds.  ?Pulmonary:  ?   Effort: Pulmonary effort is normal. No respiratory distress.  ?   Breath sounds: Normal breath sounds.  ?Abdominal:  ?   General: Bowel sounds are normal. There  is no distension.  ?   Tenderness: There is no abdominal tenderness. There is no guarding.  ?Musculoskeletal:  ?   Right lower leg: No edema.  ?   Left lower leg: No edema.  ?Skin: ?   General: Skin is warm and dry.  ?Neurological:  ?   Mental Status: He is alert. Mental status is at baseline.  ?Psychiatric:     ?   Mood and Affect: Mood normal.     ?   Behavior: Behavior normal.  ? ? ?Imaging: ?No results found. ? ?Labs: ? ?CBC: ?Recent Labs  ?  04/18/21 ?1648  ?WBC 5.1  ?HGB 12.3*  ?HCT 39.2  ?PLT 334  ? ? ?COAGS: ?No results for input(s): INR, APTT in the last 8760 hours. ? ?BMP: ?Recent Labs  ?  04/18/21 ?1648  ?NA 134*  ?K 4.3  ?CL 99  ?CO2 18*  ?GLUCOSE 92  ?BUN 14  ?CALCIUM 8.5*  ?CREATININE 1.58*  ?GFRNONAA 56*  ? ? ?LIVER FUNCTION TESTS: ?No results for input(s): BILITOT, AST, ALT, ALKPHOS, PROT, ALBUMIN in the last 8760 hours. ? ?TUMOR MARKERS: ?No results for input(s): AFPTM, CEA, CA199, CHROMGRNA in the last 8760 hours. ? ?Assessment and Plan: ?History of autism, CKD stage 3 and seizures. Pt was referred to IR by Dr. Anthonette Legato for random renal biopsy for proteinuria and microscopic hematuria. Due to pt medical history, procedure will be performed under general anesthesia.  ? ? ?Pt is alert at his baseline per caregiver. Pt answers questions but is poor historian d/t history of autism.  ?He is in no distress.  ?Pt is NPO per caregiver. ?Caregiver reports that pt had grand mal seizure 3 days ago, was brought to ED and released back home. Caregiver reports no issue since.  ?No blood thinners on med rec. ?Dr. Dwaine Gale aware that pt is hypertensive this am d/t not taking morning BP meds.  ? ?Risks and benefits of ransom renal biopsy was discussed with the  patient's legal guardian including, but not limited to bleeding, infection, damage to adjacent structures or low yield requiring additional tests. ? ?All of the questions were answered and there is agreement to proceed. ? ?Consent signed and in chart.   ? ?Thank you for this interesting consult.  I greatly enjoyed meeting Miguel Hale and look forward to participating in their care.  A copy of this report was sent to the requesting provider on this date. ? ?Electronically Signed: ?Tyson Alias, NP ?04/20/2021, 8:40 AM ? ? ?I spent a total of 20 minutes in face to face in clinical consultation, greater than 50% of which was counseling/coordinating care for random renal biopsy with general anesthesia.  ?

## 2021-04-19 NOTE — Progress Notes (Signed)
Called caregiver Tammy @ [phone] on 04/19/21 @ 11:20 am and left VM with reminders about appointment on 04/20/21, need for patient to be NPO after midnight, need for driver post procedure and need to arrive at 8:00 am for 9:00 am procedure. ?

## 2021-04-20 ENCOUNTER — Encounter: Payer: Self-pay | Admitting: Radiology

## 2021-04-20 ENCOUNTER — Encounter: Payer: Self-pay | Admitting: Registered Nurse

## 2021-04-20 ENCOUNTER — Ambulatory Visit
Admission: RE | Admit: 2021-04-20 | Discharge: 2021-04-20 | Disposition: A | Discharge status: Home / Self Care | Payer: Medicare Other | Source: Ambulatory Visit | Attending: Nephrology | Admitting: Nephrology

## 2021-04-20 ENCOUNTER — Other Ambulatory Visit: Payer: Self-pay

## 2021-04-20 ENCOUNTER — Ambulatory Visit: Payer: Self-pay

## 2021-04-20 DIAGNOSIS — I129 Hypertensive chronic kidney disease with stage 1 through stage 4 chronic kidney disease, or unspecified chronic kidney disease: Secondary | ICD-10-CM | POA: Insufficient documentation

## 2021-04-20 DIAGNOSIS — R3129 Other microscopic hematuria: Secondary | ICD-10-CM | POA: Insufficient documentation

## 2021-04-20 DIAGNOSIS — F840 Autistic disorder: Secondary | ICD-10-CM | POA: Insufficient documentation

## 2021-04-20 DIAGNOSIS — R809 Proteinuria, unspecified: Secondary | ICD-10-CM | POA: Insufficient documentation

## 2021-04-20 DIAGNOSIS — N183 Chronic kidney disease, stage 3 unspecified: Secondary | ICD-10-CM | POA: Insufficient documentation

## 2021-04-20 DIAGNOSIS — G40409 Other generalized epilepsy and epileptic syndromes, not intractable, without status epilepticus: Secondary | ICD-10-CM | POA: Insufficient documentation

## 2021-04-20 LAB — PROTIME-INR
INR: 1.1 (ref 0.8–1.2)
Prothrombin Time: 14.1 seconds (ref 11.4–15.2)

## 2021-04-20 MED ORDER — AMLODIPINE BESYLATE 10 MG PO TABS
10.0000 mg | ORAL_TABLET | Freq: Once | ORAL | Status: AC
  Administered 2021-04-20: 10 mg via ORAL
  Filled 2021-04-20: qty 1

## 2021-04-20 MED ORDER — GLYCOPYRROLATE 0.2 MG/ML IJ SOLN
INTRAMUSCULAR | Status: DC | PRN
  Administered 2021-04-20: .2 mg via INTRAVENOUS

## 2021-04-20 MED ORDER — MIDAZOLAM HCL 2 MG/2ML IJ SOLN
INTRAMUSCULAR | Status: DC | PRN
  Administered 2021-04-20: 2 mg via INTRAVENOUS

## 2021-04-20 MED ORDER — EPHEDRINE SULFATE (PRESSORS) 50 MG/ML IJ SOLN
INTRAMUSCULAR | Status: DC | PRN
  Administered 2021-04-20: 10 mg via INTRAVENOUS

## 2021-04-20 MED ORDER — FENTANYL CITRATE (PF) 100 MCG/2ML IJ SOLN
INTRAMUSCULAR | Status: DC | PRN
  Administered 2021-04-20 (×2): 50 ug via INTRAVENOUS

## 2021-04-20 MED ORDER — AMLODIPINE BESYLATE 5 MG PO TABS: 5.0000 mg | ORAL_TABLET | Freq: Every day | ORAL | Status: DC

## 2021-04-20 MED ORDER — SODIUM CHLORIDE 0.9 % IV SOLN
INTRAVENOUS | Status: DC
  Filled 2021-04-20: qty 1000

## 2021-04-20 MED ORDER — PROPOFOL 500 MG/50ML IV EMUL
INTRAVENOUS | Status: DC | PRN
  Administered 2021-04-20: 50 ug/kg/min via INTRAVENOUS

## 2021-04-20 MED ORDER — LIDOCAINE HCL 1 % IJ SOLN
INTRAMUSCULAR | Status: AC
  Administered 2021-04-20: 6 mL
  Filled 2021-04-20: qty 20

## 2021-04-20 MED ORDER — DEXMEDETOMIDINE (PRECEDEX) IN NS 20 MCG/5ML (4 MCG/ML) IV SYRINGE
PREFILLED_SYRINGE | INTRAVENOUS | Status: DC | PRN
  Administered 2021-04-20: 8 ug via INTRAVENOUS
  Administered 2021-04-20: 12 ug via INTRAVENOUS

## 2021-04-20 NOTE — Transfer of Care (Signed)
Immediate Anesthesia Transfer of Care Note ? ?Patient: Keiston Manley ? ?Procedure(s) Performed: Renal Biopsy ? ?Patient Location: PACU and Cath Lab ? ?Anesthesia Type:General ? ?Level of Consciousness: awake ? ?Airway & Oxygen Therapy: Patient Spontanous Breathing ? ?Post-op Assessment: Report given to RN ? ?Post vital signs: stable ? ?Last Vitals:  ?Vitals Value Taken Time  ?BP 127/85 04/20/21 0956  ?Temp    ?Pulse 71 04/20/21 0958  ?Resp 15 04/20/21 0958  ?SpO2 98 % 04/20/21 0958  ?Vitals shown include unvalidated device data. ? ?Last Pain:  ?Vitals:  ? 04/20/21 0828  ?TempSrc: Oral  ?PainSc: 0-No pain  ?   ? ?  ? ?Complications: No notable events documented. ?

## 2021-04-20 NOTE — Procedures (Signed)
Interventional Radiology Procedure Note ? ?Procedure: US guided random left renal biopsy ? ?Indication: Proteinuria ? ?Findings: Please refer to procedural dictation for full description. ? ?Complications: None ? ?EBL: < 10 mL ? ?Acquanetta Belling, MD [phone] ? ? ?

## 2021-04-20 NOTE — Anesthesia Postprocedure Evaluation (Signed)
Anesthesia Post Note ? ?Patient: Miguel Hale ? ?Procedure(s) Performed: Renal Biopsy ? ?Patient location during evaluation: PACU ?Anesthesia Type: General ?Level of consciousness: awake and alert ?Pain management: pain level controlled ?Vital Signs Assessment: post-procedure vital signs reviewed and stable ?Respiratory status: spontaneous breathing, nonlabored ventilation, respiratory function stable and patient connected to nasal cannula oxygen ?Cardiovascular status: blood pressure returned to baseline and stable ?Postop Assessment: no apparent nausea or vomiting ?Anesthetic complications: no ? ? ?No notable events documented. ? ? ?Last Vitals: There were no vitals filed for this visit.  ?Last Pain: There were no vitals filed for this visit. ? ?  ?  ?  ?  ?  ?  ? ?Yevette Edwards ? ? ? ? ? ?

## 2021-04-20 NOTE — Anesthesia Preprocedure Evaluation (Signed)
Anesthesia Evaluation  ?Patient identified by MRN, date of birth, ID band ?Patient awake ? ? ? ?Reviewed: ?Allergy & Precautions, H&P , NPO status , Patient's Chart, lab work & pertinent test results, reviewed documented beta blocker date and time  ? ?Airway ?Mallampati: II ? ? ?Neck ROM: full ? ? ? Dental ? ?(+) Poor Dentition ?  ?Pulmonary ?neg pulmonary ROS,  ?  ?Pulmonary exam normal ? ? ? ? ? ? ? Cardiovascular ?negative cardio ROS ?Normal cardiovascular exam ?Rhythm:regular Rate:Normal ? ? ?  ?Neuro/Psych ?Seizures -, Well Controlled,  PSYCHIATRIC DISORDERS   ? GI/Hepatic ?negative GI ROS, Neg liver ROS,   ?Endo/Other  ?negative endocrine ROS ? Renal/GU ?negative Renal ROS  ?negative genitourinary ?  ?Musculoskeletal ? ? Abdominal ?  ?Peds ? Hematology ?negative hematology ROS ?(+)   ?Anesthesia Other Findings ?Past Medical History: ?No date: Autism ?No date: Seizures ([facility]) ?No past surgical history on file. ? ? Reproductive/Obstetrics ?negative OB ROS ? ?  ? ? ? ? ? ? ? ? ? ? ? ? ? ?  ?  ? ? ? ? ? ? ? ? ?Anesthesia Physical ?Anesthesia Plan ? ?ASA: 2 ? ?Anesthesia Plan: General  ? ?Post-op Pain Management:   ? ?Induction:  ? ?PONV Risk Score and Plan:  ? ?Airway Management Planned:  ? ?Additional Equipment:  ? ?Intra-op Plan:  ? ?Post-operative Plan:  ? ?Informed Consent: I have reviewed the patients History and Physical, chart, labs and discussed the procedure including the risks, benefits and alternatives for the proposed anesthesia with the patient or authorized representative who has indicated his/her understanding and acceptance.  ? ? ? ?Dental Advisory Given ? ?Plan Discussed with: CRNA ? ?Anesthesia Plan Comments:   ? ? ? ? ? ? ?Anesthesia Quick Evaluation ? ?

## 2021-05-01 ENCOUNTER — Encounter: Payer: Self-pay | Admitting: Nephrology

## 2021-05-01 LAB — SURGICAL PATHOLOGY

## 2022-09-14 ENCOUNTER — Other Ambulatory Visit: Payer: Self-pay | Admitting: Nurse Practitioner

## 2022-09-14 DIAGNOSIS — R160 Hepatomegaly, not elsewhere classified: Secondary | ICD-10-CM

## 2022-09-27 ENCOUNTER — Ambulatory Visit
Admission: RE | Admit: 2022-09-27 | Discharge: 2022-09-27 | Disposition: A | Discharge status: Home / Self Care | Payer: Medicare Other | Source: Ambulatory Visit | Attending: Nurse Practitioner | Admitting: Nurse Practitioner

## 2022-09-27 DIAGNOSIS — R160 Hepatomegaly, not elsewhere classified: Secondary | ICD-10-CM | POA: Insufficient documentation

## 2022-09-27 MED ORDER — IOHEXOL 300 MG/ML  SOLN
80.0000 mL | Freq: Once | INTRAMUSCULAR | Status: AC | PRN
Start: 2022-09-27 — End: 2022-09-27
  Administered 2022-09-27: 80 mL via INTRAVENOUS

## 2023-05-13 ENCOUNTER — Encounter: Attending: Nurse Practitioner | Admitting: Dietician

## 2023-05-13 ENCOUNTER — Encounter: Payer: Self-pay | Admitting: Dietician

## 2023-05-13 VITALS — Ht 70.0 in | Wt 255.4 lb

## 2023-05-13 DIAGNOSIS — Z713 Dietary counseling and surveillance: Secondary | ICD-10-CM | POA: Insufficient documentation

## 2023-05-13 DIAGNOSIS — Z6836 Body mass index (BMI) 36.0-36.9, adult: Secondary | ICD-10-CM | POA: Diagnosis not present

## 2023-05-13 DIAGNOSIS — N183 Chronic kidney disease, stage 3 unspecified: Secondary | ICD-10-CM

## 2023-05-13 DIAGNOSIS — K760 Fatty (change of) liver, not elsewhere classified: Secondary | ICD-10-CM

## 2023-05-13 DIAGNOSIS — N1831 Chronic kidney disease, stage 3a: Secondary | ICD-10-CM | POA: Diagnosis present

## 2023-05-13 DIAGNOSIS — E66812 Obesity, class 2: Secondary | ICD-10-CM

## 2023-05-13 DIAGNOSIS — K219 Gastro-esophageal reflux disease without esophagitis: Secondary | ICD-10-CM | POA: Diagnosis not present

## 2023-05-13 DIAGNOSIS — I129 Hypertensive chronic kidney disease with stage 1 through stage 4 chronic kidney disease, or unspecified chronic kidney disease: Secondary | ICD-10-CM | POA: Diagnosis not present

## 2023-05-13 NOTE — Patient Instructions (Signed)
 Serve low sodium foods whenever possible Keep meat portions to 3oz or less with each meal Control portions of starchy foods -- 1-2pcs/oz bread, 1cup or less of rice/ potatoes/ pasta/ corn Include 30 minutes of physical activity daily, ok to accumulate a few minutes at a time.

## 2023-05-13 NOTE — Progress Notes (Signed)
 Medical Nutrition Therapy: Visit start time: 1045  end time: 1120  Assessment:   Referral Diagnosis: Hepatic steatosis, CKD stage 3 Other medical history/ diagnoses: HTN, GERD Psychosocial issues/ stress concerns: autism  Medications, supplements: reconciled list in medical record   Current weight: 255.4lbs Height: 5'10" BMI: 36.65   Progress and evaluation:  Following structured eating pattern with new management company staffing group home residence, but it is more food with meal, and more snacks than served in the past, resulting in weight gain for patient as well as other residents in the home. Menus have been developed/ accepted by RD. All meals and snacks are prepared for patient. Kitchen is locked to limit access, but on occasions when accidentally left unlocked, patient has been found in kitchen searching for food.  Now on low sugar pattern, diet sodas, lower sugar desserts.   Patient accepts and eats all foods served.  Recent labs 03/25/23 indicate GFR 45, potassium 3.3, sodium 136, phosphorus 3, BUN 20, Creatinine 1.89; blood sugar 115; hepatic function panel normal as of 03/25/23; fat deposits noted on CT scan 10/10/22 Food allergies: none known Walking/ activity is encouraged by staff, with some structured activities taking place.   Dietary Intake:  Usual eating pattern includes 3 meals and 3 snacks per day. Dining out frequency: 0 meals per week. Who plans meals/ buys groceries? caregivers Who prepares meals? caregivers  Breakfast: 05/13/23 waffles, bacon, oj, coffee Snack: peanut butter or cheese and crackers; popcorn; fruit cup Lunch: 12:30pm 05/12/23 chicken and cheese sandwich, fruit (per patient) Snack: 2pm same as am Supper: 5:30pm chicken/ meat loaf/ fish + starch + veg + fruit  Snack: 7pm same as am Beverages: water, tea with equal sweetener, milk, coffee  Physical activity: walking   Intervention:   Nutrition Care Education:    Fatty liver disease/ weight  control: importance of low sugar/ refined starches, and low fat choices; portion control/ limiting access to food outside of structured meals and snacks; importance of physical activity  Other intervention notes: Changes have been made to improve diet quality for patient in order to reduce fatty liver Listed recommendations on form provided by group home, to remain in patient's record.  Recommendations also listed in Patient Instructions. No follow up scheduled at this time; caregiver(s) to schedule later if needed.    Nutritional Diagnosis:  [place]-2.1 Inpaired nutrition utilization and [place]-2.2 Altered nutrition-related laboratory As related to hepatic steatosis, chronic kidney disease.  As evidenced by abnormal CT scan; low GFR. [place]-3.3 Overweight/obesity As related to excess calories.  As evidenced by patient with current BMI of 36.65.   Education Materials given:  Visit summary with goals/ instructions   Learner/ who was taught:  Patient  Caregiver: Tammy McKinney  Level of understanding: Verbalizes/ demonstrates competency (caregiver)  Demonstrated degree of understanding via:   Teach back Learning barriers: Cognitive and communication limitations due to autism  Willingness to learn/ readiness for change: Eager, change in progress (caregiver)  Monitoring and Evaluation:  Dietary intake, exercise, renal function, liver health, and body weight      follow up: prn

## 2023-05-15 ENCOUNTER — Ambulatory Visit: Attending: Otolaryngology

## 2023-05-15 DIAGNOSIS — G4761 Periodic limb movement disorder: Secondary | ICD-10-CM | POA: Insufficient documentation

## 2023-05-15 DIAGNOSIS — G478 Other sleep disorders: Secondary | ICD-10-CM | POA: Diagnosis not present

## 2023-05-15 DIAGNOSIS — G4733 Obstructive sleep apnea (adult) (pediatric): Secondary | ICD-10-CM | POA: Diagnosis not present

## 2023-05-15 DIAGNOSIS — G479 Sleep disorder, unspecified: Secondary | ICD-10-CM | POA: Diagnosis present

## 2023-05-15 DIAGNOSIS — R000 Tachycardia, unspecified: Secondary | ICD-10-CM | POA: Insufficient documentation

## 2023-05-15 DIAGNOSIS — G4710 Hypersomnia, unspecified: Secondary | ICD-10-CM | POA: Diagnosis present
